# Patient Record
Sex: Female | Born: 1985 | Hispanic: Yes | Smoking: Never smoker
Health system: Southern US, Community
[De-identification: ages and names within clinical notes are randomized; demographics above are authoritative.]

## PROBLEM LIST (undated history)

## (undated) DIAGNOSIS — O1490 Unspecified pre-eclampsia, unspecified trimester: Secondary | ICD-10-CM

## (undated) HISTORY — DX: Unspecified pre-eclampsia, unspecified trimester: O14.90

---

## 2014-09-27 LAB — OB RESULTS CONSOLE PLATELET COUNT: PLATELETS: 193 10*3/uL

## 2014-09-27 LAB — HEMOGLOBINOPATHY EVALUATION
Cystic Fibrosis Mutat: NEGATIVE
Drug Screen, Urine: NEGATIVE
Glucose, GTT - 1 Hour: 95 mg/dL (ref ?–200)
Hemoglobin, Elp: NORMAL
Pap Smear: NORMAL
Urine Culture, OB: NEGATIVE

## 2014-09-27 LAB — OB RESULTS CONSOLE HGB/HCT, BLOOD
HEMATOCRIT: 39 %
Hemoglobin: 13.2 g/dL

## 2014-09-27 LAB — OB RESULTS CONSOLE ANTIBODY SCREEN: ANTIBODY SCREEN: NEGATIVE

## 2014-09-27 LAB — OB RESULTS CONSOLE HIV ANTIBODY (ROUTINE TESTING): HIV: NONREACTIVE

## 2014-09-27 LAB — OB RESULTS CONSOLE RUBELLA ANTIBODY, IGM: RUBELLA: IMMUNE

## 2014-09-27 LAB — OB RESULTS CONSOLE ABO/RH: RH TYPE: POSITIVE

## 2014-09-27 LAB — OB RESULTS CONSOLE GC/CHLAMYDIA
Chlamydia: NEGATIVE
Gonorrhea: NEGATIVE

## 2014-09-27 LAB — OB RESULTS CONSOLE RPR: RPR: NONREACTIVE

## 2014-09-27 LAB — OB RESULTS CONSOLE HEPATITIS B SURFACE ANTIGEN: Hepatitis B Surface Ag: NEGATIVE

## 2014-10-22 ENCOUNTER — Encounter: Payer: Self-pay | Admitting: Obstetrics & Gynecology

## 2014-10-22 ENCOUNTER — Ambulatory Visit (INDEPENDENT_AMBULATORY_CARE_PROVIDER_SITE_OTHER): Payer: Medicaid Other | Admitting: Obstetrics & Gynecology

## 2014-10-22 VITALS — BP 117/77 | HR 79 | Temp 97.9°F | Ht 61.0 in | Wt 167.7 lb

## 2014-10-22 DIAGNOSIS — O0990 Supervision of high risk pregnancy, unspecified, unspecified trimester: Secondary | ICD-10-CM | POA: Insufficient documentation

## 2014-10-22 DIAGNOSIS — O0992 Supervision of high risk pregnancy, unspecified, second trimester: Secondary | ICD-10-CM | POA: Diagnosis not present

## 2014-10-22 DIAGNOSIS — Z23 Encounter for immunization: Secondary | ICD-10-CM

## 2014-10-22 DIAGNOSIS — O09219 Supervision of pregnancy with history of pre-term labor, unspecified trimester: Secondary | ICD-10-CM | POA: Insufficient documentation

## 2014-10-22 DIAGNOSIS — O09212 Supervision of pregnancy with history of pre-term labor, second trimester: Secondary | ICD-10-CM

## 2014-10-22 LAB — POCT URINALYSIS DIP (DEVICE)
Bilirubin Urine: NEGATIVE
Glucose, UA: NEGATIVE mg/dL
HGB URINE DIPSTICK: NEGATIVE
Ketones, ur: NEGATIVE mg/dL
NITRITE: NEGATIVE
PH: 7.5 (ref 5.0–8.0)
Protein, ur: NEGATIVE mg/dL
SPECIFIC GRAVITY, URINE: 1.02 (ref 1.005–1.030)
UROBILINOGEN UA: 0.2 mg/dL (ref 0.0–1.0)

## 2014-10-22 NOTE — Progress Notes (Signed)
Anatomy U/S scheduled for 11/12/2014 @ 7:30AM

## 2014-10-22 NOTE — Patient Instructions (Signed)

## 2014-10-22 NOTE — Progress Notes (Signed)
Here for first visit, transferring care from health department Given pregnancy education information .

## 2014-10-22 NOTE — Progress Notes (Signed)
Nutrition note: 1st visit consult Pt has h/o obesity. Pt has gained 5.7# @ [redacted]w[redacted]d, which is wnl. Pt reports eating 3 meals & 2-3 snacks/d. Pt is taking a PNV. Pt reports having some nausea in the morning & has some heartburn. NKFA. Pt received verbal & written education on general nutrition during pregnancy. Discussed tips to decrease nausea & heartburn. Discussed wt gain goals of 11-20# or 0.5#/wk. Pt agrees to continue taking a PNV. Pt does not have WIC and does not plan to apply right now. Pt plans to BF. F/u as needed Blondell Reveal, MS, RD, LDN, [facility]

## 2014-10-22 NOTE — Progress Notes (Signed)
   Subjective: referred by HD    Krista Mahoney is a [id number] [redacted]w[redacted]d being seen today for her first obstetrical visit.  Her obstetrical history is significant for pre-eclampsia and and IOL preterm. Patient does intend to breast feed. Pregnancy history fully reviewed.  Patient reports cramping.  Filed Vitals:   10/22/14 0912 10/22/14 0914  BP: 117/77   Pulse: 79   Temp: 97.9 F (36.6 C)   Height:   (1.549 m)  Weight: 167 lb 11.2 oz (76.068 kg)     HISTORY: OB History  Gravida Para Term Preterm AB SAB TAB Ectopic Multiple Living  # Outcome Date GA Lbr Len/2nd Weight Sex Delivery Anes PTL Lv  4 Current           3 SAB 2008             Comments: no complications  2 Preterm 12/12/03 [redacted]w[redacted]d  7 lb (3.175 kg) F Vag-Spont EPI  Y     Comments: delivered at [place], preterm delivery- not sure why induced.   1 Preterm 09/15/02 [redacted]w[redacted]d  4 lb (1.814 kg) M Vag-Spont   Y     Comments: delivered at [facility], induced preterm for pre-eclampsia.      Past Medical History  Diagnosis Date  . Preeclampsia    History reviewed. No pertinent past surgical history. Family History  Problem Relation Age of Onset  . Hypertension Mother      Exam    Uterus:     Pelvic Exam:                               System:     Skin: normal coloration and turgor, no rashes    Neurologic: oriented, normal mood   Extremities: normal strength, tone, and muscle mass   HEENT sclera clear, anicteric   Mouth/Teeth dental hygiene good   Neck supple   Cardiovascular: regular rate and rhythm   Respiratory:  appears well, vitals normal, no respiratory distress, acyanotic, normal RR   Abdomen: soft, non-tender; bowel sounds normal; no masses,  no organomegaly          Assessment:    Pregnancy: [id number] Patient Active Problem List   Diagnosis Date Noted  . Previous preterm delivery, antepartum 10/22/2014  . Supervision of high risk pregnancy, antepartum 10/22/2014        Plan:     Initial labs drawn. Prenatal vitamins. Problem list reviewed and updated. Genetic Screening discussed Quad Screen: ordered.  Ultrasound discussed; fetal survey: ordered.  Follow up in 4 weeks. 50% of 30 min visit spent on counseling and coordination of care.  ASA 81 mg /day   Barabara Motz 10/22/2014

## 2014-10-24 LAB — AFP, QUAD SCREEN
AFP: 22.7 ng/mL
Age Alone: 1:817 {titer}
Curr Gest Age: 16.5 wks.days
HCG, Total: 37.08 IU/mL
INH: 261.8 pg/mL
INTERPRETATION-AFP: NEGATIVE
MOM FOR AFP: 0.72
MOM FOR INH: 1.98
MoM for hCG: 1.38
OPEN SPINA BIFIDA: NEGATIVE
Osb Risk: <1:27300 {titer}
TRI 18 SCR RISK EST: NEGATIVE
Trisomy 18 (Edward) Syndrome Interp.: 1:51500 {titer}
uE3 Mom: 1.14
uE3 Value: 1.3 ng/mL

## 2014-11-05 ENCOUNTER — Encounter: Payer: Self-pay | Admitting: *Deleted

## 2014-11-06 ENCOUNTER — Encounter: Payer: Self-pay | Admitting: *Deleted

## 2014-11-12 ENCOUNTER — Other Ambulatory Visit: Payer: Self-pay | Admitting: Obstetrics & Gynecology

## 2014-11-12 ENCOUNTER — Ambulatory Visit (HOSPITAL_COMMUNITY)
Admission: RE | Admit: 2014-11-12 | Discharge: 2014-11-12 | Disposition: A | Discharge status: Home / Self Care | Payer: Medicaid Other | Source: Ambulatory Visit | Attending: Obstetrics & Gynecology | Admitting: Obstetrics & Gynecology

## 2014-11-12 DIAGNOSIS — O09292 Supervision of pregnancy with other poor reproductive or obstetric history, second trimester: Secondary | ICD-10-CM

## 2014-11-12 DIAGNOSIS — Z3A19 19 weeks gestation of pregnancy: Secondary | ICD-10-CM | POA: Diagnosis not present

## 2014-11-12 DIAGNOSIS — Z3689 Encounter for other specified antenatal screening: Secondary | ICD-10-CM

## 2014-11-12 DIAGNOSIS — Z36 Encounter for antenatal screening of mother: Secondary | ICD-10-CM | POA: Insufficient documentation

## 2014-11-12 DIAGNOSIS — O09219 Supervision of pregnancy with history of pre-term labor, unspecified trimester: Secondary | ICD-10-CM | POA: Insufficient documentation

## 2014-11-12 DIAGNOSIS — O09212 Supervision of pregnancy with history of pre-term labor, second trimester: Secondary | ICD-10-CM

## 2014-11-12 DIAGNOSIS — O09299 Supervision of pregnancy with other poor reproductive or obstetric history, unspecified trimester: Secondary | ICD-10-CM | POA: Insufficient documentation

## 2014-11-12 DIAGNOSIS — O0992 Supervision of high risk pregnancy, unspecified, second trimester: Secondary | ICD-10-CM

## 2014-11-19 ENCOUNTER — Ambulatory Visit (INDEPENDENT_AMBULATORY_CARE_PROVIDER_SITE_OTHER): Payer: Self-pay | Admitting: Family Medicine

## 2014-11-19 VITALS — BP 114/59 | HR 75 | Temp 97.7°F | Wt 170.4 lb

## 2014-11-19 DIAGNOSIS — O09212 Supervision of pregnancy with history of pre-term labor, second trimester: Secondary | ICD-10-CM

## 2014-11-19 DIAGNOSIS — O0992 Supervision of high risk pregnancy, unspecified, second trimester: Secondary | ICD-10-CM

## 2014-11-19 DIAGNOSIS — N83202 Unspecified ovarian cyst, left side: Secondary | ICD-10-CM

## 2014-11-19 LAB — POCT URINALYSIS DIP (DEVICE)
BILIRUBIN URINE: NEGATIVE
Glucose, UA: NEGATIVE mg/dL
HGB URINE DIPSTICK: NEGATIVE
KETONES UR: NEGATIVE mg/dL
NITRITE: NEGATIVE
Protein, ur: NEGATIVE mg/dL
SPECIFIC GRAVITY, URINE: 1.015 (ref 1.005–1.030)
Urobilinogen, UA: 0.2 mg/dL (ref 0.0–1.0)
pH: 7 (ref 5.0–8.0)

## 2014-11-19 NOTE — Progress Notes (Signed)
Subjective:  Krista Mahoney is a 29 y.o. [phone] at 6119w5d being seen today for ongoing prenatal care.  Patient reports no complaints.  Contractions: Not present.  Vag. Bleeding: None. Movement: Present. Denies leaking of fluid.   The following portions of the patient's history were reviewed and updated as appropriate: allergies, current medications, past family history, past medical history, past social history, past surgical history and problem list. Problem list updated.  Objective:   Filed Vitals:   11/19/14 0830  BP: 114/59  Pulse: 75  Temp: 97.7 F (36.5 C)  Weight: 170 lb 6.4 oz (77.293 kg)    Fetal Status: Fetal Heart Rate (bpm): 147 Fundal Height: 21 cm Movement: Present     General:  Alert, oriented and cooperative. Patient is in no acute distress.  Skin: Skin is warm and dry. No rash noted.   Cardiovascular: Normal heart rate noted  Respiratory: Normal respiratory effort, no problems with respiration noted  Abdomen: Soft, gravid, appropriate for gestational age. Pain/Pressure: Present     Pelvic: Vag. Bleeding: None     Cervical exam deferred        Extremities: Normal range of motion.  Edema: None  Mental Status: Normal mood and affect. Normal behavior. Normal judgment and thought content.   Urinalysis: Urine Protein: Negative Urine Glucose: Negative  Assessment and Plan:  Pregnancy: [id number] at 2919w5d  1. Supervision of high risk pregnancy, antepartum, second trimester Continue routine prenatal care.  - US MFM OB FOLLOW UP; Future to f/u spine  2. Previous preterm delivery, antepartum, second trimester Due to pre-eclampsia, on Baby ASA  3. Cyst of left ovary F/u possible dermoid with u/s  Preterm labor symptoms and general obstetric precautions including but not limited to vaginal bleeding, contractions, leaking of fluid and fetal movement were reviewed in detail with the patient. Please refer to After Visit Summary for other counseling  recommendations.  Return in 4 weeks (on 12/17/2014).   Reva Boresanya S Kamilya Wakeman, MD

## 2014-11-19 NOTE — Progress Notes (Signed)
Followup U/S with [facility] 12/24/14 @ 8a.  09/27/14 HIV results from [facility] obtained.

## 2014-11-19 NOTE — Progress Notes (Signed)
Patient reports lower back pain  Reviewed tip of week with patient 

## 2014-11-19 NOTE — Patient Instructions (Signed)
Second Trimester of Pregnancy The second trimester is from week 13 through week 28, months 4 through 6. The second trimester is often a time when you feel your best. Your body has also adjusted to being pregnant, and you begin to feel better physically. Usually, morning sickness has lessened or quit completely, you may have more energy, and you may have an increase in appetite. The second trimester is also a time when the fetus is growing rapidly. At the end of the sixth month, the fetus is about 9 inches long and weighs about 1 pounds. You will likely begin to feel the baby move (quickening) between 18 and 20 weeks of the pregnancy. BODY CHANGES Your body goes through many changes during pregnancy. The changes vary from woman to woman.   Your weight will continue to increase. You will notice your lower abdomen bulging out.  You may begin to get stretch marks on your hips, abdomen, and breasts.  You may develop headaches that can be relieved by medicines approved by your health care provider.  You may urinate more often because the fetus is pressing on your bladder.  You may develop or continue to have heartburn as a result of your pregnancy.  You may develop constipation because certain hormones are causing the muscles that push waste through your intestines to slow down.  You may develop hemorrhoids or swollen, bulging veins (varicose veins).  You may have back pain because of the weight gain and pregnancy hormones relaxing your joints between the bones in your pelvis and as a result of a shift in weight and the muscles that support your balance.  Your breasts will continue to grow and be tender.  Your gums may bleed and may be sensitive to brushing and flossing.  Dark spots or blotches (chloasma, mask of pregnancy) may develop on your face. This will likely fade after the baby is born.  A dark line from your belly button to the pubic area (linea nigra) may appear. This will likely  fade after the baby is born.  You may have changes in your hair. These can include thickening of your hair, rapid growth, and changes in texture. Some women also have hair loss during or after pregnancy, or hair that feels dry or thin. Your hair will most likely return to normal after your baby is born. WHAT TO EXPECT AT YOUR PRENATAL VISITS During a routine prenatal visit:  You will be weighed to make sure you and the fetus are growing normally.  Your blood pressure will be taken.  Your abdomen will be measured to track your baby's growth.  The fetal heartbeat will be listened to.  Any test results from the previous visit will be discussed. Your health care provider may ask you:  How you are feeling.  If you are feeling the baby move.  If you have had any abnormal symptoms, such as leaking fluid, bleeding, severe headaches, or abdominal cramping.  If you are using any tobacco products, including cigarettes, chewing tobacco, and electronic cigarettes.  If you have any questions. Other tests that may be performed during your second trimester include:  Blood tests that check for:  Low iron levels (anemia).  Gestational diabetes (between 24 and 28 weeks).  Rh antibodies.  Urine tests to check for infections, diabetes, or protein in the urine.  An ultrasound to confirm the proper growth and development of the baby.  An amniocentesis to check for possible genetic problems.  Fetal screens for spina bifida   and Down syndrome.  HIV (human immunodeficiency virus) testing. Routine prenatal testing includes screening for HIV, unless you choose not to have this test. HOME CARE INSTRUCTIONS   Avoid all smoking, herbs, alcohol, and unprescribed drugs. These chemicals affect the formation and growth of the baby.  Do not use any tobacco products, including cigarettes, chewing tobacco, and electronic cigarettes. If you need help quitting, ask your health care provider. You may receive  counseling support and other resources to help you quit.  Follow your health care provider's instructions regarding medicine use. There are medicines that are either safe or unsafe to take during pregnancy.  Exercise only as directed by your health care provider. Experiencing uterine cramps is a good sign to stop exercising.  Continue to eat regular, healthy meals.  Wear a good support bra for breast tenderness.  Do not use hot tubs, steam rooms, or saunas.  Wear your seat belt at all times when driving.  Avoid raw meat, uncooked cheese, cat litter boxes, and soil used by cats. These carry germs that can cause birth defects in the baby.  Take your prenatal vitamins.  Take 1500-2000 mg of calcium daily starting at the 20th week of pregnancy until you deliver your baby.  Try taking a stool softener (if your health care provider approves) if you develop constipation. Eat more high-fiber foods, such as fresh vegetables or fruit and whole grains. Drink plenty of fluids to keep your urine clear or pale yellow.  Take warm sitz baths to soothe any pain or discomfort caused by hemorrhoids. Use hemorrhoid cream if your health care provider approves.  If you develop varicose veins, wear support hose. Elevate your feet for 15 minutes, 3-4 times a day. Limit salt in your diet.  Avoid heavy lifting, wear low heel shoes, and practice good posture.  Rest with your legs elevated if you have leg cramps or low back pain.  Visit your dentist if you have not gone yet during your pregnancy. Use a soft toothbrush to brush your teeth and be gentle when you floss.  A sexual relationship may be continued unless your health care provider directs you otherwise.  Continue to go to all your prenatal visits as directed by your health care provider. SEEK MEDICAL CARE IF:   You have dizziness.  You have mild pelvic cramps, pelvic pressure, or nagging pain in the abdominal area.  You have persistent nausea,  vomiting, or diarrhea.  You have a bad smelling vaginal discharge.  You have pain with urination. SEEK IMMEDIATE MEDICAL CARE IF:   You have a fever.  You are leaking fluid from your vagina.  You have spotting or bleeding from your vagina.  You have severe abdominal cramping or pain.  You have rapid weight gain or loss.  You have shortness of breath with chest pain.  You notice sudden or extreme swelling of your face, hands, ankles, feet, or legs.  You have not felt your baby move in over an hour.  You have severe headaches that do not go away with medicine.  You have vision changes.   This information is not intended to replace advice given to you by your health care provider. Make sure you discuss any questions you have with your health care provider.   Document Released: 12/23/2000 Document Revised: 01/19/2014 Document Reviewed: 03/01/2012 Elsevier Interactive Patient Education 2016 [employer]   Breastfeeding Deciding to breastfeed is one of the best choices you can make for you and your baby. A change   in hormones during pregnancy causes your breast tissue to grow and increases the number and size of your milk ducts. These hormones also allow proteins, sugars, and fats from your blood supply to make breast milk in your milk-producing glands. Hormones prevent breast milk from being released before your baby is born as well as prompt milk flow after birth. Once breastfeeding has begun, thoughts of your baby, as well as his or her sucking or crying, can stimulate the release of milk from your milk-producing glands.  BENEFITS OF BREASTFEEDING For Your Baby  Your first milk (colostrum) helps your baby's digestive system function better.  There are antibodies in your milk that help your baby fight off infections.  Your baby has a lower incidence of asthma, allergies, and sudden infant death syndrome.  The nutrients in breast milk are better for your baby than infant  formulas and are designed uniquely for your baby's needs.  Breast milk improves your baby's brain development.  Your baby is less likely to develop other conditions, such as childhood obesity, asthma, or type 2 diabetes mellitus. For You  Breastfeeding helps to create a very special bond between you and your baby.  Breastfeeding is convenient. Breast milk is always available at the correct temperature and costs nothing.  Breastfeeding helps to burn calories and helps you lose the weight gained during pregnancy.  Breastfeeding makes your uterus contract to its prepregnancy size faster and slows bleeding (lochia) after you give birth.   Breastfeeding helps to lower your risk of developing type 2 diabetes mellitus, osteoporosis, and breast or ovarian cancer later in life. SIGNS THAT YOUR BABY IS HUNGRY Early Signs of Hunger  Increased alertness or activity.  Stretching.  Movement of the head from side to side.  Movement of the head and opening of the mouth when the corner of the mouth or cheek is stroked (rooting).  Increased sucking sounds, smacking lips, cooing, sighing, or squeaking.  Hand-to-mouth movements.  Increased sucking of fingers or hands. Late Signs of Hunger  Fussing.  Intermittent crying. Extreme Signs of Hunger Signs of extreme hunger will require calming and consoling before your baby will be able to breastfeed successfully. Do not wait for the following signs of extreme hunger to occur before you initiate breastfeeding:  Restlessness.  A loud, strong cry.  Screaming. BREASTFEEDING BASICS Breastfeeding Initiation  Find a comfortable place to sit or lie down, with your neck and back well supported.  Place a pillow or rolled up blanket under your baby to bring him or her to the level of your breast (if you are seated). Nursing pillows are specially designed to help support your arms and your baby while you breastfeed.  Make sure that your baby's  abdomen is facing your abdomen.  Gently massage your breast. With your fingertips, massage from your chest wall toward your nipple in a circular motion. This encourages milk flow. You may need to continue this action during the feeding if your milk flows slowly.  Support your breast with 4 fingers underneath and your thumb above your nipple. Make sure your fingers are well away from your nipple and your baby's mouth.  Stroke your baby's lips gently with your finger or nipple.  When your baby's mouth is open wide enough, quickly bring your baby to your breast, placing your entire nipple and as much of the colored area around your nipple (areola) as possible into your baby's mouth.  More areola should be visible above your baby's upper lip than   below the lower lip.  Your baby's tongue should be between his or her lower gum and your breast.  Ensure that your baby's mouth is correctly positioned around your nipple (latched). Your baby's lips should create a seal on your breast and be turned out (everted).  It is common for your baby to suck about 2-3 minutes in order to start the flow of breast milk. Latching Teaching your baby how to latch on to your breast properly is very important. An improper latch can cause nipple pain and decreased milk supply for you and poor weight gain in your baby. Also, if your baby is not latched onto your nipple properly, he or she may swallow some air during feeding. This can make your baby fussy. Burping your baby when you switch breasts during the feeding can help to get rid of the air. However, teaching your baby to latch on properly is still the best way to prevent fussiness from swallowing air while breastfeeding. Signs that your baby has successfully latched on to your nipple:  Silent tugging or silent sucking, without causing you pain.  Swallowing heard between every 3-4 sucks.  Muscle movement above and in front of his or her ears while sucking. Signs  that your baby has not successfully latched on to nipple:  Sucking sounds or smacking sounds from your baby while breastfeeding.  Nipple pain. If you think your baby has not latched on correctly, slip your finger into the corner of your baby's mouth to break the suction and place it between your baby's gums. Attempt breastfeeding initiation again. Signs of Successful Breastfeeding Signs from your baby:  A gradual decrease in the number of sucks or complete cessation of sucking.  Falling asleep.  Relaxation of his or her body.  Retention of a small amount of milk in his or her mouth.  Letting go of your breast by himself or herself. Signs from you:  Breasts that have increased in firmness, weight, and size 1-3 hours after feeding.  Breasts that are softer immediately after breastfeeding.  Increased milk volume, as well as a change in milk consistency and color by the fifth day of breastfeeding.  Nipples that are not sore, cracked, or bleeding. Signs That Your Baby is Getting Enough Milk  Wetting at least 3 diapers in a 24-hour period. The urine should be clear and pale yellow by age 5 days.  At least 3 stools in a 24-hour period by age 5 days. The stool should be soft and yellow.  At least 3 stools in a 24-hour period by age 7 days. The stool should be seedy and yellow.  No loss of weight greater than 10% of birth weight during the first 3 days of age.  Average weight gain of 4-7 ounces (113-198 g) per week after age 4 days.  Consistent daily weight gain by age 5 days, without weight loss after the age of 2 weeks. After a feeding, your baby may spit up a small amount. This is common. BREASTFEEDING FREQUENCY AND DURATION Frequent feeding will help you make more milk and can prevent sore nipples and breast engorgement. Breastfeed when you feel the need to reduce the fullness of your breasts or when your baby shows signs of hunger. This is called "breastfeeding on demand." Avoid  introducing a pacifier to your baby while you are working to establish breastfeeding (the first 4-6 weeks after your baby is born). After this time you may choose to use a pacifier. Research has shown that   pacifier use during the first year of a baby's life decreases the risk of sudden infant death syndrome (SIDS). Allow your baby to feed on each breast as long as he or she wants. Breastfeed until your baby is finished feeding. When your baby unlatches or falls asleep while feeding from the first breast, offer the second breast. Because newborns are often sleepy in the first few weeks of life, you may need to awaken your baby to get him or her to feed. Breastfeeding times will vary from baby to baby. However, the following rules can serve as a guide to help you ensure that your baby is properly fed:  Newborns (babies 4 weeks of age or younger) may breastfeed every 1-3 hours.  Newborns should not go longer than 3 hours during the day or 5 hours during the night without breastfeeding.  You should breastfeed your baby a minimum of 8 times in a 24-hour period until you begin to introduce solid foods to your baby at around 6 months of age. BREAST MILK PUMPING Pumping and storing breast milk allows you to ensure that your baby is exclusively fed your breast milk, even at times when you are unable to breastfeed. This is especially important if you are going back to work while you are still breastfeeding or when you are not able to be present during feedings. Your lactation consultant can give you guidelines on how long it is safe to store breast milk. A breast pump is a machine that allows you to pump milk from your breast into a sterile bottle. The pumped breast milk can then be stored in a refrigerator or freezer. Some breast pumps are operated by hand, while others use electricity. Ask your lactation consultant which type will work best for you. Breast pumps can be purchased, but some hospitals and  breastfeeding support groups lease breast pumps on a monthly basis. A lactation consultant can teach you how to hand express breast milk, if you prefer not to use a pump. CARING FOR YOUR BREASTS WHILE YOU BREASTFEED Nipples can become dry, cracked, and sore while breastfeeding. The following recommendations can help keep your breasts moisturized and healthy:  Avoid using soap on your nipples.  Wear a supportive bra. Although not required, special nursing bras and tank tops are designed to allow access to your breasts for breastfeeding without taking off your entire bra or top. Avoid wearing underwire-style bras or extremely tight bras.  Air dry your nipples for 3-4minutes after each feeding.  Use only cotton bra pads to absorb leaked breast milk. Leaking of breast milk between feedings is normal.  Use lanolin on your nipples after breastfeeding. Lanolin helps to maintain your skin's normal moisture barrier. If you use pure lanolin, you do not need to wash it off before feeding your baby again. Pure lanolin is not toxic to your baby. You may also hand express a few drops of breast milk and gently massage that milk into your nipples and allow the milk to air dry. In the first few weeks after giving birth, some women experience extremely full breasts (engorgement). Engorgement can make your breasts feel heavy, warm, and tender to the touch. Engorgement peaks within 3-5 days after you give birth. The following recommendations can help ease engorgement:  Completely empty your breasts while breastfeeding or pumping. You may want to start by applying warm, moist heat (in the shower or with warm water-soaked hand towels) just before feeding or pumping. This increases circulation and helps the milk   flow. If your baby does not completely empty your breasts while breastfeeding, pump any extra milk after he or she is finished.  Wear a snug bra (nursing or regular) or tank top for 1-2 days to signal your body  to slightly decrease milk production.  Apply ice packs to your breasts, unless this is too uncomfortable for you.  Make sure that your baby is latched on and positioned properly while breastfeeding. If engorgement persists after 48 hours of following these recommendations, contact your health care provider or a lactation consultant. OVERALL HEALTH CARE RECOMMENDATIONS WHILE BREASTFEEDING  Eat healthy foods. Alternate between meals and snacks, eating 3 of each per day. Because what you eat affects your breast milk, some of the foods may make your baby more irritable than usual. Avoid eating these foods if you are sure that they are negatively affecting your baby.  Drink milk, fruit juice, and water to satisfy your thirst (about 10 glasses a day).  Rest often, relax, and continue to take your prenatal vitamins to prevent fatigue, stress, and anemia.  Continue breast self-awareness checks.  Avoid chewing and smoking tobacco. Chemicals from cigarettes that pass into breast milk and exposure to secondhand smoke may harm your baby.  Avoid alcohol and drug use, including marijuana. Some medicines that may be harmful to your baby can pass through breast milk. It is important to ask your health care provider before taking any medicine, including all over-the-counter and prescription medicine as well as vitamin and herbal supplements. It is possible to become pregnant while breastfeeding. If birth control is desired, ask your health care provider about options that will be safe for your baby. SEEK MEDICAL CARE IF:  You feel like you want to stop breastfeeding or have become frustrated with breastfeeding.  You have painful breasts or nipples.  Your nipples are cracked or bleeding.  Your breasts are red, tender, or warm.  You have a swollen area on either breast.  You have a fever or chills.  You have nausea or vomiting.  You have drainage other than breast milk from your nipples.  Your  breasts do not become full before feedings by the fifth day after you give birth.  You feel sad and depressed.  Your baby is too sleepy to eat well.  Your baby is having trouble sleeping.   Your baby is wetting less than 3 diapers in a 24-hour period.  Your baby has less than 3 stools in a 24-hour period.  Your baby's skin or the white part of his or her eyes becomes yellow.   Your baby is not gaining weight by 5 days of age. SEEK IMMEDIATE MEDICAL CARE IF:  Your baby is overly tired (lethargic) and does not want to wake up and feed.  Your baby develops an unexplained fever.   This information is not intended to replace advice given to you by your health care provider. Make sure you discuss any questions you have with your health care provider.   Document Released: 12/29/2004 Document Revised: 09/19/2014 Document Reviewed: 06/22/2012 Elsevier Interactive Patient Education 2016 [employer]  

## 2014-12-20 ENCOUNTER — Ambulatory Visit (INDEPENDENT_AMBULATORY_CARE_PROVIDER_SITE_OTHER): Payer: Self-pay | Admitting: Obstetrics & Gynecology

## 2014-12-20 VITALS — BP 117/65 | HR 73 | Temp 97.8°F | Wt 174.8 lb

## 2014-12-20 DIAGNOSIS — O3482 Maternal care for other abnormalities of pelvic organs, second trimester: Secondary | ICD-10-CM

## 2014-12-20 DIAGNOSIS — O09299 Supervision of pregnancy with other poor reproductive or obstetric history, unspecified trimester: Secondary | ICD-10-CM | POA: Insufficient documentation

## 2014-12-20 DIAGNOSIS — N83202 Unspecified ovarian cyst, left side: Secondary | ICD-10-CM

## 2014-12-20 DIAGNOSIS — O09292 Supervision of pregnancy with other poor reproductive or obstetric history, second trimester: Secondary | ICD-10-CM

## 2014-12-20 DIAGNOSIS — O0992 Supervision of high risk pregnancy, unspecified, second trimester: Secondary | ICD-10-CM

## 2014-12-20 LAB — POCT URINALYSIS DIP (DEVICE)
BILIRUBIN URINE: NEGATIVE
Glucose, UA: NEGATIVE mg/dL
HGB URINE DIPSTICK: NEGATIVE
Ketones, ur: NEGATIVE mg/dL
NITRITE: NEGATIVE
PH: 7 (ref 5.0–8.0)
Protein, ur: NEGATIVE mg/dL
SPECIFIC GRAVITY, URINE: 1.02 (ref 1.005–1.030)
Urobilinogen, UA: 0.2 mg/dL (ref 0.0–1.0)

## 2014-12-20 NOTE — Patient Instructions (Signed)
Return to clinic for any obstetric concerns or go to [facility] for evaluation  

## 2014-12-20 NOTE — Progress Notes (Signed)
Breastfeeding tip of the week reviewed. 

## 2014-12-20 NOTE — Progress Notes (Signed)
Subjective:  Karen KaysGuadalupe Avendano Vazquez is a 29 y.o. [phone] at 4452w1d being seen today for ongoing prenatal care.  She is currently monitored for the following issues for this high-risk pregnancy and has Previous preterm delivery, antepartum; Supervision of high risk pregnancy, antepartum; Cyst of left ovary; and Hx of preeclampsia, prior pregnancy, currently pregnant on her problem list.  Patient reports no complaints.  Contractions: Not present. Vag. Bleeding: None.  Movement: Present. Denies leaking of fluid.   The following portions of the patient's history were reviewed and updated as appropriate: allergies, current medications, past family history, past medical history, past social history, past surgical history and problem list. Problem list updated.  Objective:   Filed Vitals:   12/20/14 0822  BP: 117/65  Pulse: 73  Temp: 97.8 F (36.6 C)  Weight: 174 lb 12.8 oz (79.289 kg)    Fetal Status: Fetal Heart Rate (bpm): 140 Fundal Height: 25 cm Movement: Present     General:  Alert, oriented and cooperative. Patient is in no acute distress.  Skin: Skin is warm and dry. No rash noted.   Cardiovascular: Normal heart rate noted  Respiratory: Normal respiratory effort, no problems with respiration noted  Abdomen: Soft, gravid, appropriate for gestational age. Pain/Pressure: Absent     Pelvic: Vag. Bleeding: None     Cervical exam deferred        Extremities: Normal range of motion.  Edema: None  Mental Status: Normal mood and affect. Normal behavior. Normal judgment and thought content.   Urinalysis: Urine Protein: Negative Urine Glucose: Negative  Assessment and Plan:  Pregnancy: [id number] at 2552w1d  1. Hx of preeclampsia, prior pregnancy, currently pregnant, second trimester Continue ASA 81 mg daily. Stable BP  2. Supervision of high risk pregnancy, antepartum, second trimester Preterm labor symptoms and general obstetric precautions including but not limited to vaginal bleeding,  contractions, leaking of fluid and fetal movement were reviewed in detail with the patient. Please refer to After Visit Summary for other counseling recommendations.  Return in about 3 weeks (around 01/10/2015) for 1 hr GTT, 3rd trimester labs, TDap, OB Visit.   Tereso NewcomerUgonna A Anyanwu, MD

## 2014-12-24 ENCOUNTER — Ambulatory Visit (HOSPITAL_COMMUNITY)
Admission: RE | Admit: 2014-12-24 | Discharge: 2014-12-24 | Disposition: A | Discharge status: Home / Self Care | Payer: Self-pay | Source: Ambulatory Visit | Attending: Family Medicine | Admitting: Family Medicine

## 2014-12-24 ENCOUNTER — Other Ambulatory Visit: Payer: Self-pay | Admitting: Family Medicine

## 2014-12-24 ENCOUNTER — Encounter (HOSPITAL_COMMUNITY): Payer: Self-pay

## 2014-12-24 DIAGNOSIS — Z0489 Encounter for examination and observation for other specified reasons: Secondary | ICD-10-CM

## 2014-12-24 DIAGNOSIS — N83209 Unspecified ovarian cyst, unspecified side: Secondary | ICD-10-CM

## 2014-12-24 DIAGNOSIS — O3482 Maternal care for other abnormalities of pelvic organs, second trimester: Secondary | ICD-10-CM

## 2014-12-24 DIAGNOSIS — Z3A25 25 weeks gestation of pregnancy: Secondary | ICD-10-CM | POA: Insufficient documentation

## 2014-12-24 DIAGNOSIS — O09212 Supervision of pregnancy with history of pre-term labor, second trimester: Secondary | ICD-10-CM | POA: Insufficient documentation

## 2014-12-24 DIAGNOSIS — O0992 Supervision of high risk pregnancy, unspecified, second trimester: Secondary | ICD-10-CM

## 2014-12-24 DIAGNOSIS — IMO0002 Reserved for concepts with insufficient information to code with codable children: Secondary | ICD-10-CM

## 2014-12-24 DIAGNOSIS — Z36 Encounter for antenatal screening of mother: Secondary | ICD-10-CM | POA: Insufficient documentation

## 2014-12-24 DIAGNOSIS — O09292 Supervision of pregnancy with other poor reproductive or obstetric history, second trimester: Secondary | ICD-10-CM | POA: Insufficient documentation

## 2015-01-10 ENCOUNTER — Ambulatory Visit (INDEPENDENT_AMBULATORY_CARE_PROVIDER_SITE_OTHER): Payer: Self-pay | Admitting: Family Medicine

## 2015-01-10 ENCOUNTER — Encounter: Payer: Self-pay | Admitting: Family Medicine

## 2015-01-10 VITALS — BP 113/60 | HR 84 | Temp 97.9°F | Wt 175.2 lb

## 2015-01-10 DIAGNOSIS — O09293 Supervision of pregnancy with other poor reproductive or obstetric history, third trimester: Secondary | ICD-10-CM

## 2015-01-10 DIAGNOSIS — O0993 Supervision of high risk pregnancy, unspecified, third trimester: Secondary | ICD-10-CM

## 2015-01-10 DIAGNOSIS — O09213 Supervision of pregnancy with history of pre-term labor, third trimester: Secondary | ICD-10-CM

## 2015-01-10 LAB — CBC
HCT: 39.5 % (ref 36.0–46.0)
Hemoglobin: 13.1 g/dL (ref 12.0–15.0)
MCH: 29.7 pg (ref 26.0–34.0)
MCHC: 33.2 g/dL (ref 30.0–36.0)
MCV: 89.6 fL (ref 78.0–100.0)
MPV: 12 fL (ref 8.6–12.4)
PLATELETS: 236 10*3/uL (ref 150–400)
RBC: 4.41 MIL/uL (ref 3.87–5.11)
RDW: 13.9 % (ref 11.5–15.5)
WBC: 11.2 10*3/uL — AB (ref 4.0–10.5)

## 2015-01-10 LAB — POCT URINALYSIS DIP (DEVICE)
BILIRUBIN URINE: NEGATIVE
GLUCOSE, UA: NEGATIVE mg/dL
HGB URINE DIPSTICK: NEGATIVE
Ketones, ur: NEGATIVE mg/dL
LEUKOCYTES UA: NEGATIVE
NITRITE: NEGATIVE
Protein, ur: NEGATIVE mg/dL
SPECIFIC GRAVITY, URINE: 1.02 (ref 1.005–1.030)
Urobilinogen, UA: 0.2 mg/dL (ref 0.0–1.0)
pH: 7 (ref 5.0–8.0)

## 2015-01-10 MED ORDER — TETANUS-DIPHTH-ACELL PERTUSSIS 5-2.5-18.5 LF-MCG/0.5 IM SUSP
0.5000 mL | Freq: Once | INTRAMUSCULAR | Status: AC
  Administered 2015-01-10: 0.5 mL via INTRAMUSCULAR

## 2015-01-10 NOTE — Patient Instructions (Signed)
Tercer trimestre de embarazo (Third Trimester of Pregnancy) El tercer trimestre comprende desde la semana29 hasta la semana42, es decir, desde el mes7 hasta el mes9. El tercer trimestre es un perodo en el que el feto crece rpidamente. Hacia el final del noveno mes, el feto mide alrededor de 20pulgadas (45cm) de largo y pesa entre 6 y 10 libras (2,700 y 4,500kg).  CAMBIOS EN EL ORGANISMO Su organismo atraviesa por muchos cambios durante el embarazo, y estos varan de una mujer a otra.   Seguir aumentando de peso. Es de esperar que aumente entre 25 y 35libras (11 y 16kg) hacia el final del embarazo.  Podrn aparecer las primeras estras en las caderas, el abdomen y las mamas.  Puede tener necesidad de orinar con ms frecuencia porque el feto baja hacia la pelvis y ejerce presin sobre la vejiga.  Debido al embarazo podr sentir acidez estomacal con frecuencia.  Puede estar estreida, ya que ciertas hormonas enlentecen los movimientos de los msculos que empujan los desechos a travs de los intestinos.  Pueden aparecer hemorroides o abultarse e hincharse las venas (venas varicosas).  Puede sentir dolor plvico debido al aumento de peso y a que las hormonas del embarazo relajan las articulaciones entre los huesos de la pelvis. El dolor de espalda puede ser consecuencia de la sobrecarga de los msculos que soportan la postura.  Tal vez haya cambios en el cabello que pueden incluir su engrosamiento, crecimiento rpido y cambios en la textura. Adems, a algunas mujeres se les cae el cabello durante o despus del embarazo, o tienen el cabello seco o fino. Lo ms probable es que el cabello se le normalice despus del nacimiento del beb.  Las mamas seguirn creciendo y le dolern. A veces, puede haber una secrecin amarilla de las mamas llamada calostro.  El ombligo puede salir hacia afuera.  Puede sentir que le falta el aire debido a que se expande el tero.  Puede notar que el feto  "baja" o lo siente ms bajo, en el abdomen.  Puede tener una prdida de secrecin mucosa con sangre. Esto suele ocurrir en el trmino de unos pocos das a una semana antes de que comience el trabajo de parto.  El cuello del tero se vuelve delgado y blando (se borra) cerca de la fecha de parto. QU DEBE ESPERAR EN LOS EXMENES PRENATALES  Le harn exmenes prenatales cada 2semanas hasta la semana36. A partir de ese momento le harn exmenes semanales. Durante una visita prenatal de rutina:  La pesarn para asegurarse de que usted y el feto estn creciendo normalmente.  Le tomarn la presin arterial.  Le medirn el abdomen para controlar el desarrollo del beb.  Se escucharn los latidos cardacos fetales.  Se evaluarn los resultados de los estudios solicitados en visitas anteriores.  Le revisarn el cuello del tero cuando est prxima la fecha de parto para controlar si este se ha borrado. Alrededor de la semana36, el mdico le revisar el cuello del tero. Al mismo tiempo, realizar un anlisis de las secreciones del tejido vaginal. Este examen es para determinar si hay un tipo de bacteria, estreptococo Grupo B. El mdico le explicar esto con ms detalle. El mdico puede preguntarle lo siguiente:  Cmo le gustara que fuera el parto.  Cmo se siente.  Si siente los movimientos del beb.  Si ha tenido sntomas anormales, como prdida de lquido, sangrado, dolores de cabeza intensos o clicos abdominales.  Si est consumiendo algn producto que contenga tabaco, como cigarrillos, tabaco   de mascar y cigarrillos electrnicos.  Si tiene alguna pregunta. Otros exmenes o estudios de deteccin que pueden realizarse durante el tercer trimestre incluyen lo siguiente:  Anlisis de sangre para controlar los niveles de hierro (anemia).  Controles fetales para determinar su salud, nivel de actividad y crecimiento. Si tiene alguna enfermedad o hay problemas durante el embarazo, le harn  estudios.  Prueba del VIH (virus de inmunodeficiencia humana). Si corre un riesgo alto, pueden realizarle una prueba de deteccin del VIH durante el tercer trimestre del embarazo. FALSO TRABAJO DE PARTO Es posible que sienta contracciones leves e irregulares que finalmente desaparecen. Se llaman contracciones de Braxton Hicks o falso trabajo de parto. Las contracciones pueden durar horas, das o incluso semanas, antes de que el verdadero trabajo de parto se inicie. Si las contracciones ocurren a intervalos regulares, se intensifican o se hacen dolorosas, lo mejor es que la revise el mdico.  SIGNOS DE TRABAJO DE PARTO   Clicos de tipo menstrual.  Contracciones cada 5minutos o menos.  Contracciones que comienzan en la parte superior del tero y se extienden hacia abajo, a la zona inferior del abdomen y la espalda.  Sensacin de mayor presin en la pelvis o dolor de espalda.  Una secrecin de mucosidad acuosa o con sangre que sale de la vagina. Si tiene alguno de estos signos antes de la semana37 del embarazo, llame a su mdico de inmediato. Debe concurrir al hospital para que la controlen inmediatamente. INSTRUCCIONES PARA EL CUIDADO EN EL HOGAR   Evite fumar, consumir hierbas, beber alcohol y tomar frmacos que no le hayan recetado. Estas sustancias qumicas afectan la formacin y el desarrollo del beb.  No consuma ningn producto que contenga tabaco, lo que incluye cigarrillos, tabaco de mascar y cigarrillos electrnicos. Si necesita ayuda para dejar de fumar, consulte al mdico. Puede recibir asesoramiento y otro tipo de recursos para dejar de fumar.  Siga las indicaciones del mdico en relacin con el uso de medicamentos. Durante el embarazo, hay medicamentos que son seguros de tomar y otros que no.  Haga ejercicio solamente como se lo haya indicado el mdico. Sentir clicos uterinos es un buen signo para detener la actividad fsica.  Contine comiendo alimentos sanos con  regularidad.  Use un sostn que le brinde buen soporte si le duelen las mamas.  No se d baos de inmersin en agua caliente, baos turcos ni saunas.  Use el cinturn de seguridad en todo momento mientras conduce.  No coma carne cruda ni queso sin cocinar; evite el contacto con las bandejas sanitarias de los gatos y la tierra que estos animales usan. Estos elementos contienen grmenes que pueden causar defectos congnitos en el beb.  Tome las vitaminas prenatales.  Tome entre 1500 y 2000mg de calcio diariamente comenzando en la semana20 del embarazo hasta el parto.  Si est estreida, pruebe un laxante suave (si el mdico lo autoriza). Consuma ms alimentos ricos en fibra, como vegetales y frutas frescos y cereales integrales. Beba gran cantidad de lquido para mantener la orina de tono claro o color amarillo plido.  Dese baos de asiento con agua tibia para aliviar el dolor o las molestias causadas por las hemorroides. Use una crema para las hemorroides si el mdico la autoriza.  Si tiene venas varicosas, use medias de descanso. Eleve los pies durante 15minutos, 3 o 4veces por da. Limite el consumo de sal en su dieta.  Evite levantar objetos pesados, use zapatos de tacones bajos y mantenga una buena postura.  Descanse   con las piernas elevadas si tiene calambres o dolor de cintura.  Visite a su dentista si no lo ha hecho durante el embarazo. Use un cepillo de dientes blando para higienizarse los dientes y psese el hilo dental con suavidad.  Puede seguir manteniendo relaciones sexuales, a menos que el mdico le indique lo contrario.  No haga viajes largos excepto que sea absolutamente necesario y solo con la autorizacin del mdico.  Tome clases prenatales para entender, practicar y hacer preguntas sobre el trabajo de parto y el parto.  Haga un ensayo de la partida al hospital.  Prepare el bolso que llevar al hospital.  Prepare la habitacin del beb.  Concurra a todas  las visitas prenatales segn las indicaciones de su mdico. SOLICITE ATENCIN MDICA SI:  No est segura de que est en trabajo de parto o de que ha roto la bolsa de las aguas.  Tiene mareos.  Siente clicos leves, presin en la pelvis o dolor persistente en el abdomen.  Tiene nuseas, vmitos o diarrea persistentes.  Observa una secrecin vaginal con mal olor.  Siente dolor al orinar. SOLICITE ATENCIN MDICA DE INMEDIATO SI:   Tiene fiebre.  Tiene una prdida de lquido por la vagina.  Tiene sangrado o pequeas prdidas vaginales.  Siente dolor intenso o clicos en el abdomen.  Sube o baja de peso rpidamente.  Tiene dificultad para respirar y siente dolor de pecho.  Sbitamente se le hinchan mucho el rostro, las manos, los tobillos, los pies o las piernas.  No ha sentido los movimientos del beb durante una hora.  Siente un dolor de cabeza intenso que no se alivia con medicamentos.  Su visin se modifica.   Esta informacin no tiene como fin reemplazar el consejo del mdico. Asegrese de hacerle al mdico cualquier pregunta que tenga.   Document Released: 10/08/2004 Document Revised: 01/19/2014 Elsevier Interactive Patient Education 2016 [employer]   Lactancia materna (Breastfeeding) Decidir amamantar es una de las mejores elecciones que puede hacer por usted y su beb. El cambio hormonal durante el embarazo produce el desarrollo del tejido mamario y aumenta la cantidad y el tamao de los conductos galactforos. Estas hormonas tambin permiten que las protenas, los azcares y las grasas de la sangre produzcan la leche materna en las glndulas productoras de leche. Las hormonas impiden que la leche materna sea liberada antes del nacimiento del beb, adems de impulsar el flujo de leche luego del nacimiento. Una vez que ha comenzado a amamantar, pensar en el beb, as como la succin o el llanto, pueden estimular la liberacin de leche de las glndulas productoras de  leche.  LOS BENEFICIOS DE AMAMANTAR Para el beb  La primera leche (calostro) ayuda a mejorar el funcionamiento del sistema digestivo del beb.  La leche tiene anticuerpos que ayudan a prevenir las infecciones en el beb.  El beb tiene una menor incidencia de asma, alergias y del sndrome de muerte sbita del lactante.  Los nutrientes en la leche materna son mejores para el beb que la leche maternizada y estn preparados exclusivamente para cubrir las necesidades del beb.  La leche materna mejora el desarrollo cerebral del beb.  Es menos probable que el beb desarrolle otras enfermedades, como obesidad infantil, asma o diabetes mellitus de tipo 2. Para usted   La lactancia materna favorece el desarrollo de un vnculo muy especial entre la madre y el beb.  Es conveniente. La leche materna siempre est disponible a la temperatura correcta y es econmica.  La lactancia   materna ayuda a quemar caloras y a perder el peso ganado durante el embarazo.  Favorece la contraccin del tero al tamao que tena antes del embarazo de manera ms rpida y disminuye el sangrado (loquios) despus del parto.  La lactancia materna contribuye a reducir el riesgo de desarrollar diabetes mellitus de tipo 2, osteoporosis o cncer de mama o de ovario en el futuro. SIGNOS DE QUE EL BEB EST HAMBRIENTO Primeros signos de hambre  Aumenta su estado de alerta o actividad.  Se estira.  Mueve la cabeza de un lado a otro.  Mueve la cabeza y abre la boca cuando se le toca la mejilla o la comisura de la boca (reflejo de bsqueda).  Aumenta las vocalizaciones, tales como sonidos de succin, se relame los labios, emite arrullos, suspiros, o chirridos.  Mueve la mano hacia la boca.  Se chupa con ganas los dedos o las manos. Signos tardos de hambre  Est agitado.  Llora de manera intermitente. Signos de hambre extrema Los signos de hambre extrema requerirn que lo calme y lo consuele antes de que el  beb pueda alimentarse adecuadamente. No espere a que se manifiesten los siguientes signos de hambre extrema para comenzar a amamantar:   Agitacin.  Llanto intenso y fuerte.  Gritos. INFORMACIN BSICA SOBRE LA LACTANCIA MATERNA Iniciacin de la lactancia materna  Encuentre un lugar cmodo para sentarse o acostarse, con un buen respaldo para el cuello y la espalda.  Coloque una almohada o una manta enrollada debajo del beb para acomodarlo a la altura de la mama (si est sentada). Las almohadas para amamantar se han diseado especialmente a fin de servir de apoyo para los brazos y el beb mientras amamanta.  Asegrese de que el abdomen del beb est frente al suyo.   Masajee suavemente la mama. Con las yemas de los dedos, masajee la pared del pecho hacia el pezn en un movimiento circular. Esto estimula el flujo de leche. Es posible que deba continuar este movimiento mientras amamanta si la leche fluye lentamente.  Sostenga la mama con el pulgar por arriba del pezn y los otros 4 dedos por debajo de la mama. Asegrese de que los dedos se encuentren lejos del pezn y de la boca del beb.  Empuje suavemente los labios del beb con el pezn o con el dedo.  Cuando la boca del beb se abra lo suficiente, acrquelo rpidamente a la mama e introduzca todo el pezn y la zona oscura que lo rodea (areola), tanto como sea posible, dentro de la boca del beb.  Debe haber ms areola visible por arriba del labio superior del beb que por debajo del labio inferior.  La lengua del beb debe estar entre la enca inferior y la mama.  Asegrese de que la boca del beb est en la posicin correcta alrededor del pezn (prendida). Los labios del beb deben crear un sello sobre la mama y estar doblados hacia afuera (invertidos).  Es comn que el beb succione durante 2 a 3 minutos para que comience el flujo de leche materna. Cmo debe prenderse Es muy importante que le ensee al beb cmo prenderse  adecuadamente a la mama. Si el beb no se prende adecuadamente, puede causarle dolor en el pezn y reducir la produccin de leche materna, y hacer que el beb tenga un escaso aumento de peso. Adems, si el beb no se prende adecuadamente al pezn, puede tragar aire durante la alimentacin. Esto puede causarle molestias al beb. Hacer eructar al beb al   cambiar de mama puede ayudarlo a liberar el aire. Sin embargo, ensearle al beb cmo prenderse a la mama adecuadamente es la mejor manera de evitar que se sienta molesto por tragar aire mientras se alimenta. Signos de que el beb se ha prendido adecuadamente al pezn:   Tironea o succiona de modo silencioso, sin causarle dolor.  Se escucha que traga cada 3 o 4 succiones.  Hay movimientos musculares por arriba y por delante de sus odos al succionar. Signos de que el beb no se ha prendido adecuadamente al pezn:   Hace ruidos de succin o de chasquido mientras se alimenta.  Siente dolor en el pezn. Si cree que el beb no se prendi correctamente, deslice el dedo en la comisura de la boca y colquelo entre las encas del beb para interrumpir la succin. Intente comenzar a amamantar nuevamente. Signos de lactancia materna exitosa Signos del beb:   Disminuye gradualmente el nmero de succiones o cesa la succin por completo.  Se duerme.  Relaja el cuerpo.  Retiene una pequea cantidad de leche en la boca.  Se desprende solo del pecho. Signos que presenta usted:  Las mamas han aumentado la firmeza, el peso y el tamao 1 a 3 horas despus de amamantar.  Estn ms blandas inmediatamente despus de amamantar.  Un aumento del volumen de leche, y tambin un cambio en su consistencia y color se producen hacia el quinto da de lactancia materna.  Los pezones no duelen, ni estn agrietados ni sangran. Signos de que su beb recibe la cantidad de leche suficiente  Moja al menos 3 paales en 24 horas. La orina debe ser clara y de color  amarillo plido a los 5 das de vida.  Defeca al menos 3 veces en 24 horas a los 5 das de vida. La materia fecal debe ser blanda y amarillenta.  Defeca al menos 3 veces en 24 horas a los 7 das de vida. La materia fecal debe ser grumosa y amarillenta.  No registra una prdida de peso mayor del 10% del peso al nacer durante los primeros 3 das de vida.  Aumenta de peso un promedio de 4 a 7onzas (113 a 198g) por semana despus de los 4 das de vida.  Aumenta de peso, diariamente, de manera uniforme a partir de los 5 das de vida, sin registrar prdida de peso despus de las 2semanas de vida. Despus de alimentarse, es posible que el beb regurgite una pequea cantidad. Esto es frecuente. FRECUENCIA Y DURACIN DE LA LACTANCIA MATERNA El amamantamiento frecuente la ayudar a producir ms leche y a prevenir problemas de dolor en los pezones e hinchazn en las mamas. Alimente al beb cuando muestre signos de hambre o si siente la necesidad de reducir la congestin de las mamas. Esto se denomina "lactancia a demanda". Evite el uso del chupete mientras trabaja para establecer la lactancia (las primeras 4 a 6 semanas despus del nacimiento del beb). Despus de este perodo, podr ofrecerle un chupete. Las investigaciones demostraron que el uso del chupete durante el primer ao de vida del beb disminuye el riesgo de desarrollar el sndrome de muerte sbita del lactante (SMSL). Permita que el nio se alimente en cada mama todo lo que desee. Contine amamantando al beb hasta que haya terminado de alimentarse. Cuando el beb se desprende o se queda dormido mientras se est alimentando de la primera mama, ofrzcale la segunda. Debido a que, con frecuencia, los recin nacidos permanecen somnolientos las primeras semanas de vida, es posible   que deba despertar al beb para alimentarlo. Los horarios de lactancia varan de un beb a otro. Sin embargo, las siguientes reglas pueden servir como gua para ayudarla a  garantizar que el beb se alimenta adecuadamente:  Se puede amamantar a los recin nacidos (bebs de 4 semanas o menos de vida) cada 1 a 3 horas.  No deben transcurrir ms de 3 horas durante el da o 5 horas durante la noche sin que se amamante a los recin nacidos.  Debe amamantar al beb 8 veces como mnimo en un perodo de 24 horas, hasta que comience a introducir slidos en su dieta, a los 6 meses de vida aproximadamente. EXTRACCIN DE LECHE MATERNA La extraccin y el almacenamiento de la leche materna le permiten asegurarse de que el beb se alimente exclusivamente de leche materna, aun en momentos en los que no puede amamantar. Esto tiene especial importancia si debe regresar al trabajo en el perodo en que an est amamantando o si no puede estar presente en los momentos en que el beb debe alimentarse. Su asesor en lactancia puede orientarla sobre cunto tiempo es seguro almacenar leche materna.  El sacaleche es un aparato que le permite extraer leche de la mama a un recipiente estril. Luego, la leche materna extrada puede almacenarse en un refrigerador o congelador. Algunos sacaleches son manuales, mientras que otros son elctricos. Consulte a su asesor en lactancia qu tipo ser ms conveniente para usted. Los sacaleches se pueden comprar; sin embargo, algunos hospitales y grupos de apoyo a la lactancia materna alquilan sacaleches mensualmente. Un asesor en lactancia puede ensearle cmo extraer leche materna manualmente, en caso de que prefiera no usar un sacaleche.  CMO CUIDAR LAS MAMAS DURANTE LA LACTANCIA MATERNA Los pezones se secan, agrietan y duelen durante la lactancia materna. Las siguientes recomendaciones pueden ayudarla a mantener las mamas humectadas y sanas:  Evite usar jabn en los pezones.  Use un sostn de soporte. Aunque no son esenciales, las camisetas sin mangas o los sostenes especiales para amamantar estn diseados para acceder fcilmente a las mamas, para amamantar  sin tener que quitarse todo el sostn o la camiseta. Evite usar sostenes con aro o sostenes muy ajustados.  Seque al aire sus pezones durante 3 a 4minutos despus de amamantar al beb.  Utilice solo apsitos de algodn en el sostn para absorber las prdidas de leche. La prdida de un poco de leche materna entre las tomas es normal.  Utilice lanolina sobre los pezones luego de amamantar. La lanolina ayuda a mantener la humedad normal de la piel. Si usa lanolina pura, no tiene que lavarse los pezones antes de volver a alimentar al beb. La lanolina pura no es txica para el beb. Adems, puede extraer manualmente algunas gotas de leche materna y masajear suavemente esa leche sobre los pezones, para que la leche se seque al aire. Durante las primeras semanas despus de dar a luz, algunas mujeres pueden experimentar hinchazn en las mamas (congestin mamaria). La congestin puede hacer que sienta las mamas pesadas, calientes y sensibles al tacto. El pico de la congestin ocurre dentro de los 3 a 5 das despus del parto. Las siguientes recomendaciones pueden ayudarla a aliviar la congestin:  Vace por completo las mamas al amamantar o extraer leche. Puede aplicar calor hmedo en las mamas (en la ducha o con toallas hmedas para manos) antes de amamantar o extraer leche. Esto aumenta la circulacin y ayuda a que la leche fluya. Si el beb no vaca por completo las   mamas cuando lo amamanta, extraiga la leche restante despus de que haya finalizado.  Use un sostn ajustado (para amamantar o comn) o una camiseta sin mangas durante 1 o 2 das para indicar al cuerpo que disminuya ligeramente la produccin de leche.  Aplique compresas de hielo sobre las mamas, a menos que le resulte demasiado incmodo.  Asegrese de que el beb est prendido y se encuentre en la posicin correcta mientras lo alimenta. Si la congestin persiste luego de 48 horas o despus de seguir estas recomendaciones, comunquese con su  mdico o un asesor en lactancia. RECOMENDACIONES GENERALES PARA EL CUIDADO DE LA SALUD DURANTE LA LACTANCIA MATERNA  Consuma alimentos saludables. Alterne comidas y colaciones, y coma 3 de cada una por da. Dado que lo que come afecta la leche materna, es posible que algunas comidas hagan que su beb se vuelva ms irritable de lo habitual. Evite comer este tipo de alimentos si percibe que afectan de manera negativa al beb.  Beba leche, jugos de fruta y agua para satisfacer su sed (aproximadamente 10 vasos al da).  Descanse con frecuencia, reljese y tome sus vitaminas prenatales para evitar la fatiga, el estrs y la anemia.  Contine con los autocontroles de la mama.  Evite masticar y fumar tabaco. Las sustancias qumicas de los cigarrillos que pasan a la leche materna y la exposicin al humo ambiental del tabaco pueden daar al beb.  No consuma alcohol ni drogas, incluida la marihuana. Algunos medicamentos, que pueden ser perjudiciales para el beb, pueden pasar a travs de la leche materna. Es importante que consulte a su mdico antes de tomar cualquier medicamento, incluidos todos los medicamentos recetados y de venta libre, as como los suplementos vitamnicos y herbales. Puede quedar embarazada durante la lactancia. Si desea controlar la natalidad, consulte a su mdico cules son las opciones ms seguras para el beb. SOLICITE ATENCIN MDICA SI:   Usted siente que quiere dejar de amamantar o se siente frustrada con la lactancia.  Siente dolor en las mamas o en los pezones.  Sus pezones estn agrietados o sangran.  Sus pechos estn irritados, sensibles o calientes.  Tiene un rea hinchada en cualquiera de las mamas.  Siente escalofros o fiebre.  Tiene nuseas o vmitos.  Presenta una secrecin de otro lquido distinto de la leche materna de los pezones.  Sus mamas no se llenan antes de amamantar al beb para el quinto da despus del parto.  Se siente triste y  deprimida.  El beb est demasiado somnoliento como para comer bien.  El beb tiene problemas para dormir.  Moja menos de 3 paales en 24 horas.  Defeca menos de 3 veces en 24 horas.  La piel del beb o la parte blanca de los ojos se vuelven amarillentas.  El beb no ha aumentado de peso a los 5 das de vida. SOLICITE ATENCIN MDICA DE INMEDIATO SI:   El beb est muy cansado (letargo) y no se quiere despertar para comer.  Le sube la fiebre sin causa.   Esta informacin no tiene como fin reemplazar el consejo del mdico. Asegrese de hacerle al mdico cualquier pregunta que tenga.   Document Released: 12/29/2004 Document Revised: 09/19/2014 Elsevier Interactive Patient Education 2016 [employer]  

## 2015-01-10 NOTE — Progress Notes (Signed)
Subjective:  Krista Mahoney is a 29 y.o. [id number] at 6916w1d being seen today for ongoing prenatal care.  She is currently monitored for the following issues for this high-risk pregnancy and has Previous preterm delivery, antepartum; Supervision of high risk pregnancy, antepartum; Cyst of left ovary; and Hx of preeclampsia, prior pregnancy, currently pregnant on her problem list.  Patient reports no complaints.  Contractions: Irritability. Vag. Bleeding: None.  Movement: Present. Denies leaking of fluid.   The following portions of the patient's history were reviewed and updated as appropriate: allergies, current medications, past family history, past medical history, past social history, past surgical history and problem list. Problem list updated.  Objective:   Filed Vitals:   01/10/15 0819  BP: 113/60  Pulse: 84  Temp: 97.9 F (36.6 C)  Weight: 175 lb 3.2 oz (79.47 kg)    Fetal Status: Fetal Heart Rate (bpm): 135 Fundal Height: 28 cm Movement: Present     General:  Alert, oriented and cooperative. Patient is in no acute distress.  Skin: Skin is warm and dry. No rash noted.   Cardiovascular: Normal heart rate noted  Respiratory: Normal respiratory effort, no problems with respiration noted  Abdomen: Soft, gravid, appropriate for gestational age. Pain/Pressure: Absent     Pelvic: Vag. Bleeding: None     Cervical exam deferred        Extremities: Normal range of motion.  Edema: None  Mental Status: Normal mood and affect. Normal behavior. Normal judgment and thought content.   Urinalysis: Urine Protein: Negative Urine Glucose: Negative  Assessment and Plan:  Pregnancy: [id number] at 5416w1d  1. Supervision of high risk pregnancy, antepartum, third trimester  - Glucose Tolerance, 1 HR (50g) - HIV antibody (with reflex) - RPR - Tdap (BOOSTRIX) injection 0.5 mL; Inject 0.5 mLs into the muscle once. - CBC  2. Previous preterm delivery, antepartum, third trimester Due to  pre-eclampsia, no PIH at present  3. Hx of preeclampsia, prior pregnancy, currently pregnant, third trimester BP is stable  Preterm labor symptoms and general obstetric precautions including but not limited to vaginal bleeding, contractions, leaking of fluid and fetal movement were reviewed in detail with the patient. Please refer to After Visit Summary for other counseling recommendations.  Return in 2 weeks (on 01/24/2015).   Reva Boresanya S Rayland Hamed, MD

## 2015-01-11 LAB — GLUCOSE TOLERANCE, 1 HOUR (50G) W/O FASTING: GLUCOSE 1 HOUR GTT: 122 mg/dL (ref 70–140)

## 2015-01-11 LAB — HIV ANTIBODY (ROUTINE TESTING W REFLEX): HIV: NONREACTIVE

## 2015-01-11 LAB — RPR

## 2015-01-23 ENCOUNTER — Encounter: Payer: Self-pay | Admitting: Certified Nurse Midwife

## 2015-03-06 ENCOUNTER — Encounter: Payer: Self-pay | Admitting: *Deleted

## 2015-08-27 ENCOUNTER — Encounter (HOSPITAL_COMMUNITY): Payer: Self-pay

## 2017-03-07 IMAGING — US US MFM OB COMP +14 WKS
1 series · 14 of 28 positions shown · non-contrast
Comparison: none

[Series 1: us mfm ob comp +14 wks · 94 acquisitions, 14 frames shown]
[im 4/94]
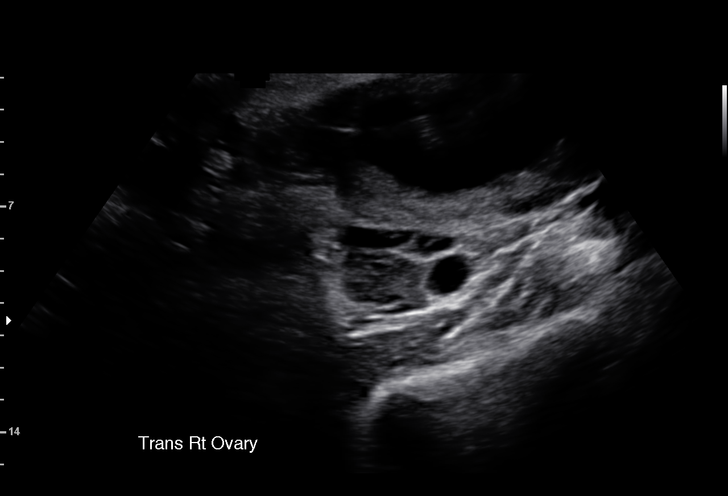
[im 11/94]
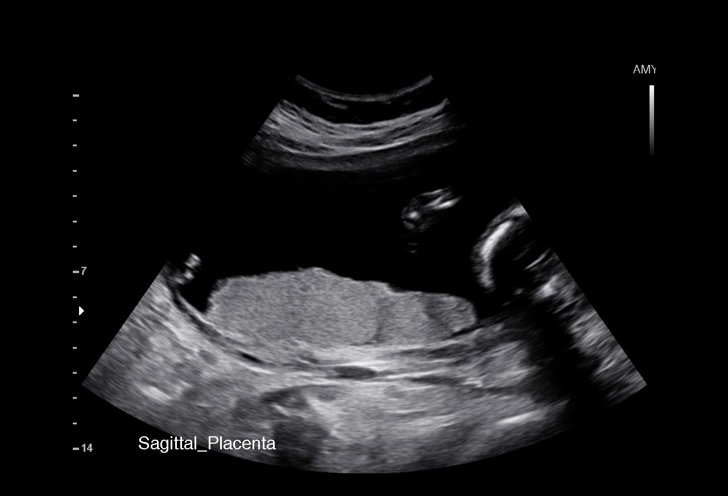
[im 18/94]
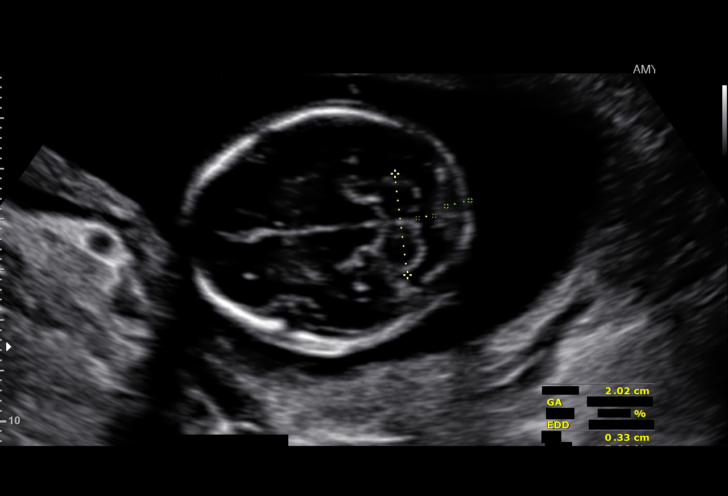
[im 25/94]
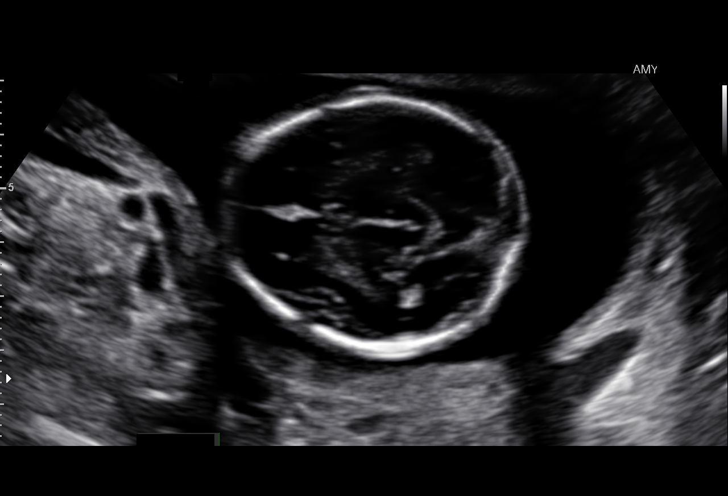
[im 32/94]
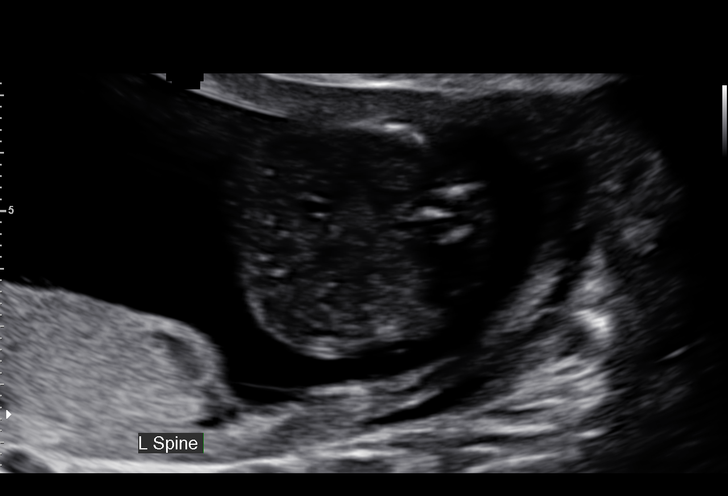
[im 38/94]
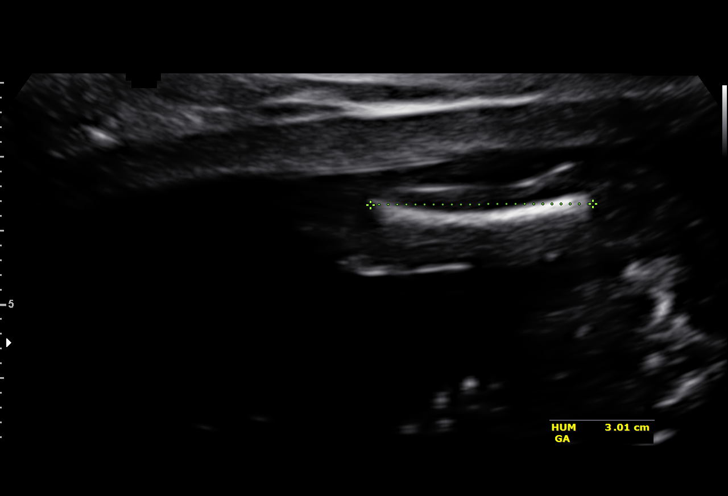
[im 45/94]
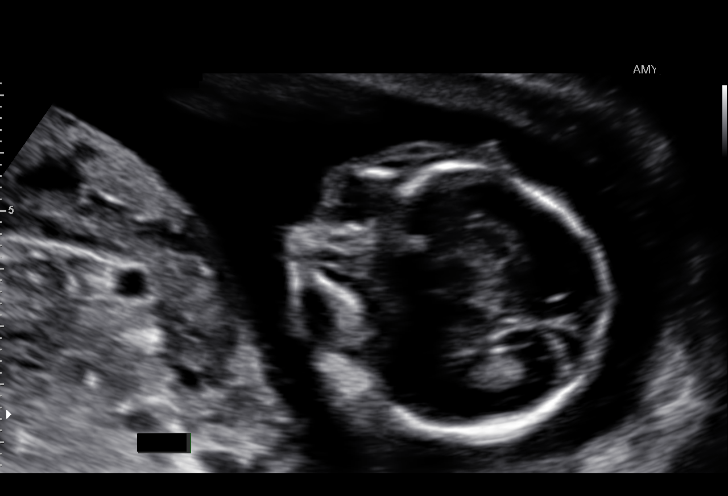
[im 52/94]
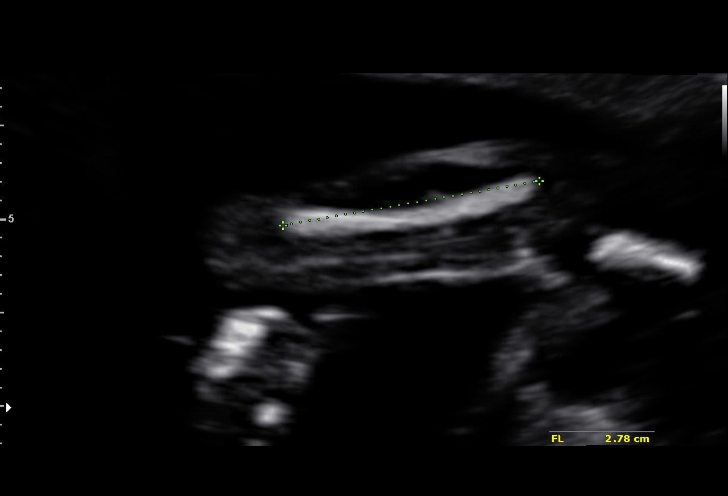
[im 59/94]
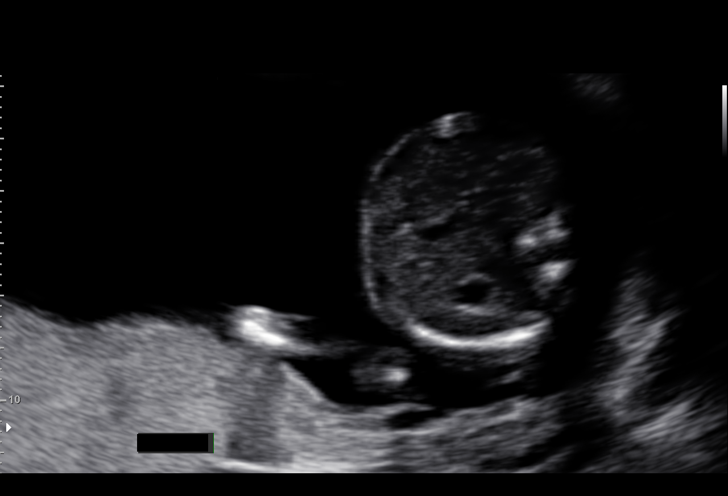
[im 66/94]
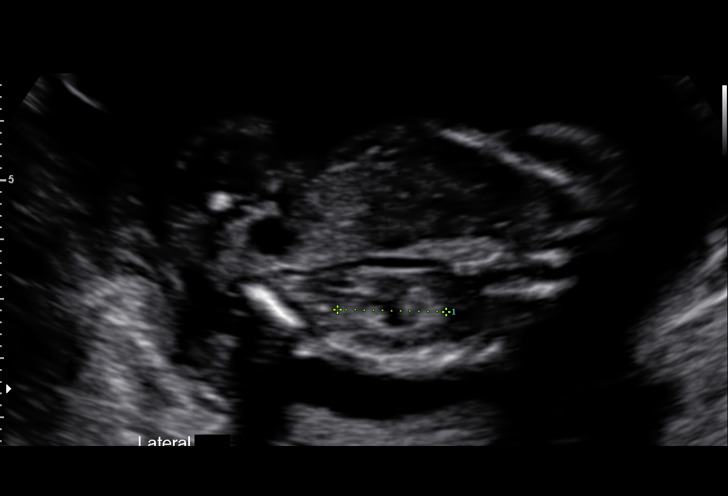
[im 73/94]
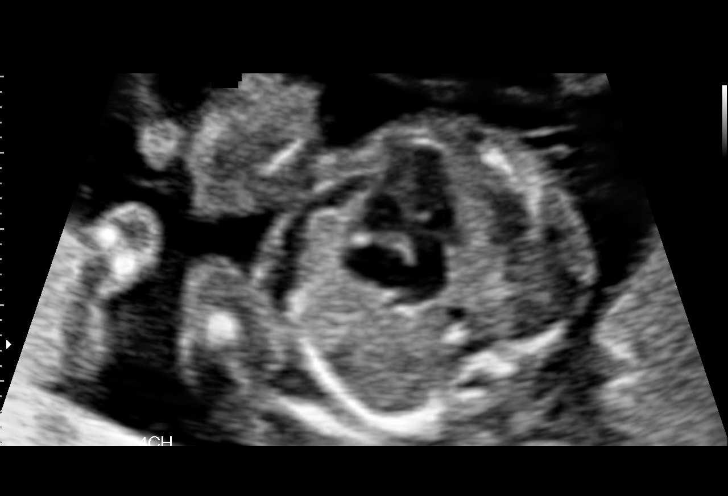
[im 80/94]
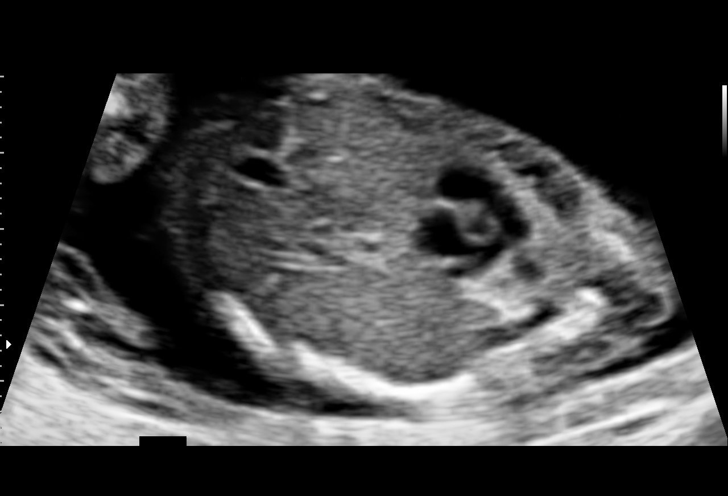
[im 87/94]
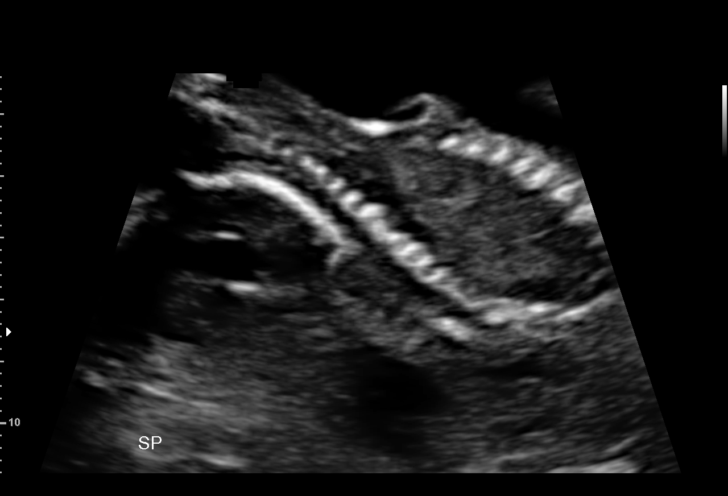
[im 94/94]
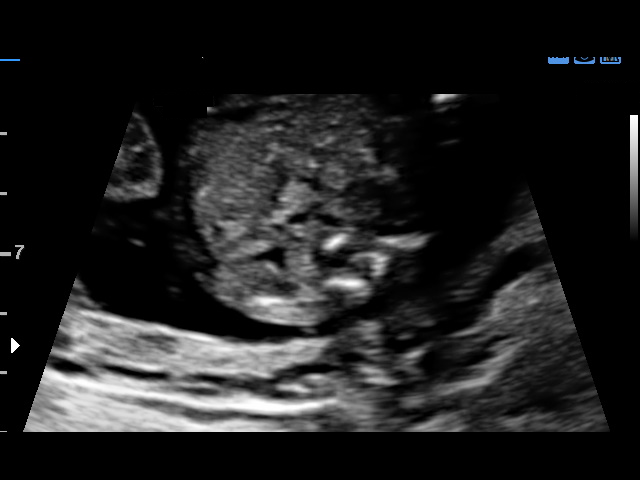

[14 of 28 positions shown; findings below may reference images not displayed]

OBSTETRICS REPORT
(Signed Final 11/12/2014 [DATE])

Name:       RTEP CFZ                    Visit  11/12/2014 [DATE]
AUJLA                              Date:

Service(s) Provided

Indications

Poor obstetric history: Previous preeclampsia /
eclampsia/gestational HTN
Poor obstetric history: Previous preterm delivery,
antepartum
19 weeks gestation of pregnancy
Basic anatomic survey                                  Z36
Fetal Evaluation

Num Of             1
Fetuses:
Fetal Heart        158                          bpm
Rate:
Cardiac Activity:  Observed
Presentation:      Cephalic
Placenta:          Posterior, above cervical
os
P. Cord            Visualized
Insertion:

Amniotic Fluid
AFI FV:      Subjectively within normal limits
Larg Pckt:      5.4  cm
Biometry

BPD:     45.8   m    G. Age:   19w 6d                 CI:        75.22   70 - 86
m
FL/HC:      16.4   16.8 -
19.8
HC:     167.5   m    G. Age:   19w 3d        29  %    HC/AC:      1.12   1.09 -
m
AC:       149   m    G. Age:   20w 1d        60  %    FL/BPD
m                                     :
FL:      27.5   m    G. Age:   18w 3d         8  %    FL/AC:      18.5   20 - 24
m
HUM:       30   m    G. Age:   19w 6d        57  %
m
CER:     20.2   m    G. Age:   19w 1d        40  %
m
NFT:       4.9  m
m

Est.         289   gm   0 lb 10 oz      41   %
FW:
Gestational Age

LMP:           19w 5d        Date:  06/27/14                  EDD:   04/03/15
U/S Today:     19w 3d                                         EDD:   04/05/15
Best:          19w 5d    Det. By:   LMP  (06/27/14)           EDD:   04/03/15
Anatomy

Cranium:          Appears normal         Aortic Arch:       Appears normal
Fetal Cavum:      Appears normal         Ductal Arch:       Appears normal
Ventricles:       Appears normal         Diaphragm:         Appears normal
Choroid Plexus:   Appears normal         Stomach:           Appears normal,
left sided
Cerebellum:       Appears normal         Abdomen:           Appears normal
Posterior         Appears normal         Abdominal          Appears nml (cord
Fossa:                                   Wall:              insert, abd wall)
Nuchal Fold:      Appears normal         Cord Vessels:      Appears normal (3
vessel cord)
Face:             Appears normal         Kidneys:           Appear normal
(orbits and profile)
Lips:             Appears normal         Bladder:           Appears normal
Palate:           Appears normal         Spine:             Limited views
appear normal
Heart:            Appears normal         Lower              Appears normal
(4CH, axis, and        Extremities:
situs)
RVOT:             Appears normal         Upper              Appears normal
Extremities:
LVOT:             Appears normal

Other:   Heels and 5th digit appear normal. Fetus appears to be a male.-
Targeted Anatomy

Fetal Central Nervous System
Lat. Ventricles:  6.0                    Cisterna
Magna:
Cervix Uterus Adnexa

Cervical Length:    4.37      cm

Left Ovary:    4.0 x 4.3 x 4.3 cm dermoid
Right Ovary:   Within normal limits.
Impression

Single IUP at 19w 5d
Limited views of the fetal spine obtained due to fetal
position - appears normal
The remainder of the fetal anatomy appears normal
Ultrasound measurements are consistent with dates
Normal amniotic fluid volume

A 4.0 x 4.3 x 4.3 cm calcified mass is noted in the left
adnexa - likely dermoid cyst
Recommendations

Recommend follow-up ultrasound examination in 6 weeks
to reevalaute the fetal spine and left adnexal mass

RAIDH MOMO with us.  Please do not hesitate to

## 2017-04-18 IMAGING — US US MFM OB FOLLOW-UP
1 series · 14 of 28 positions shown · non-contrast
Comparison: none

[Series 1: us mfm ob follow-up · 61 acquisitions, 14 frames shown]
[im 3/61]
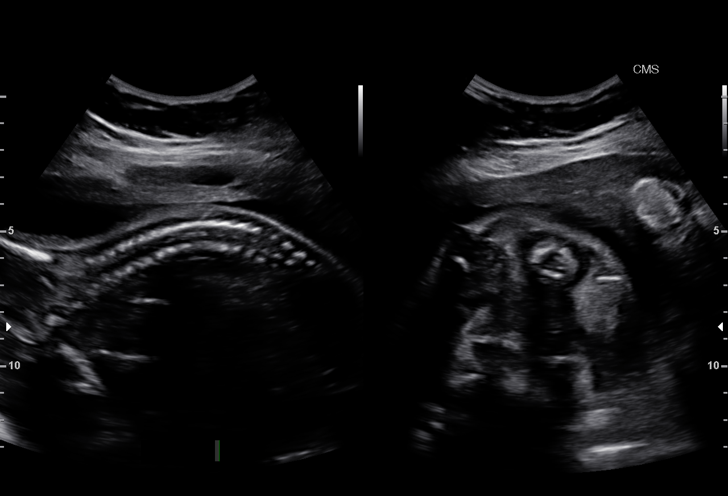
[im 7/61]
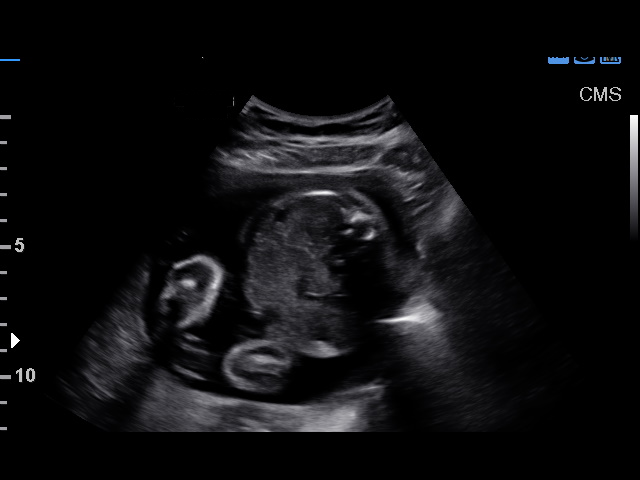
[im 12/61]
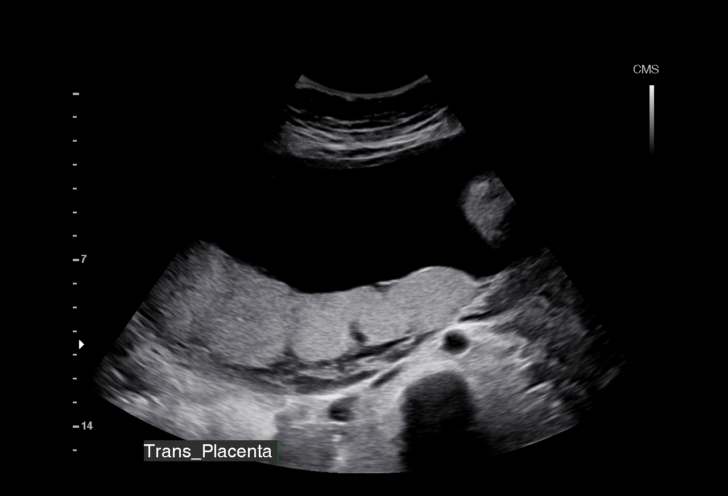
[im 16/61]
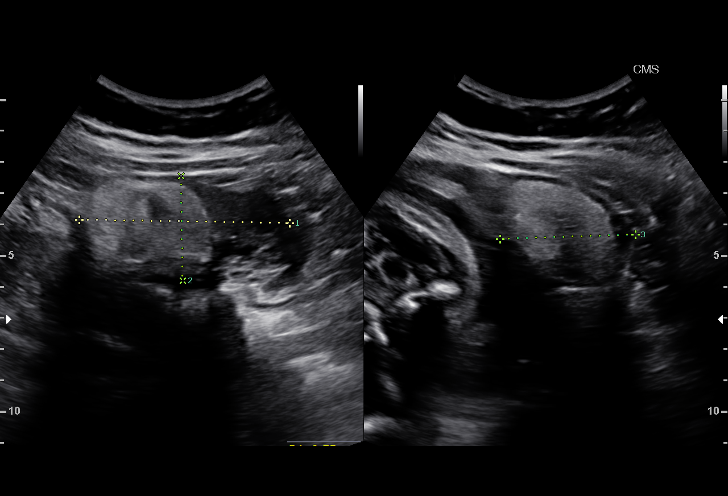
[im 21/61]
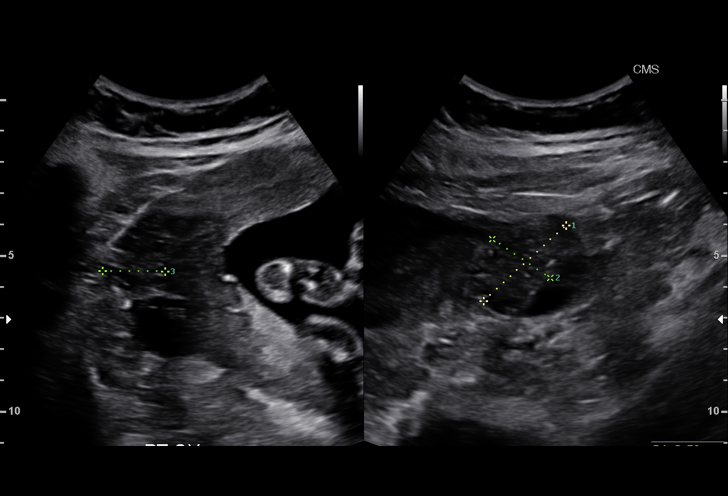
[im 25/61]
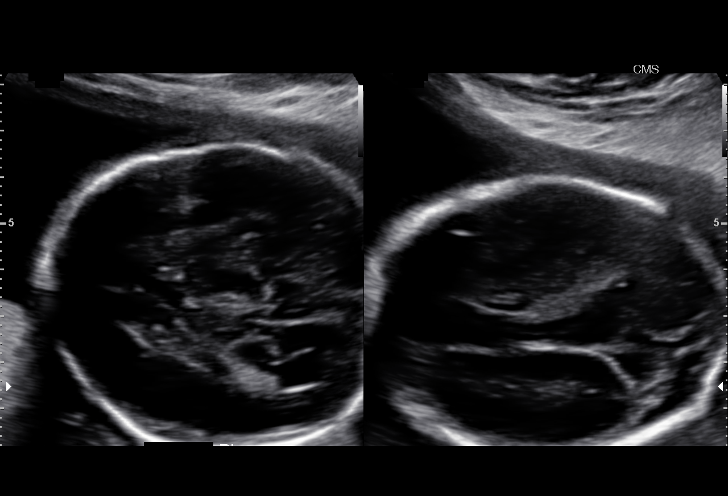
[im 29/61]
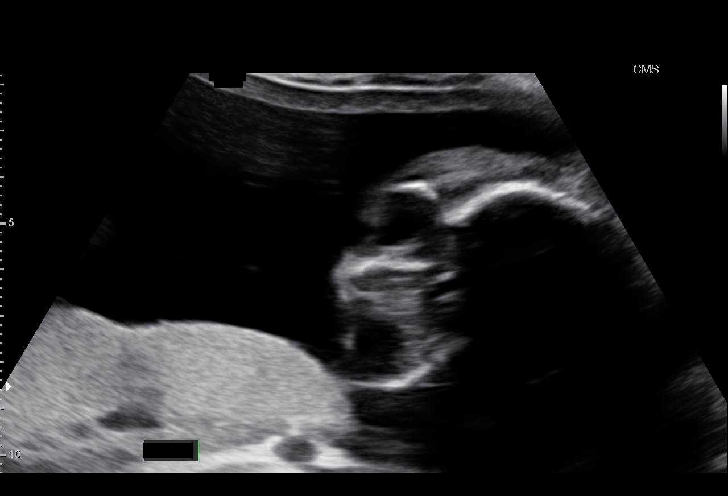
[im 34/61]
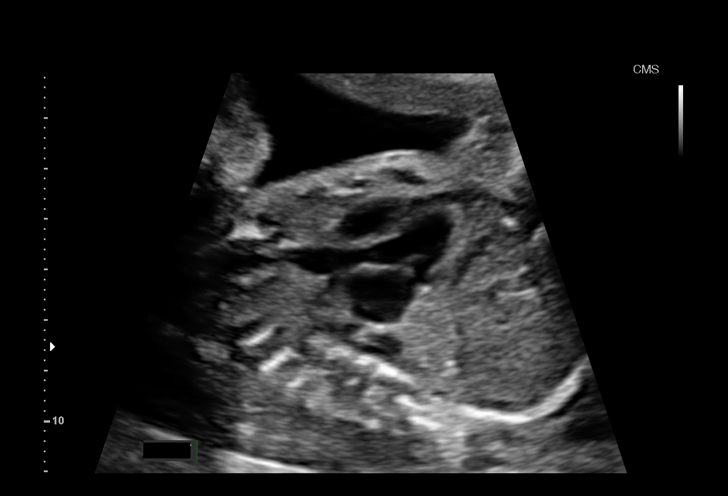
[im 38/61]
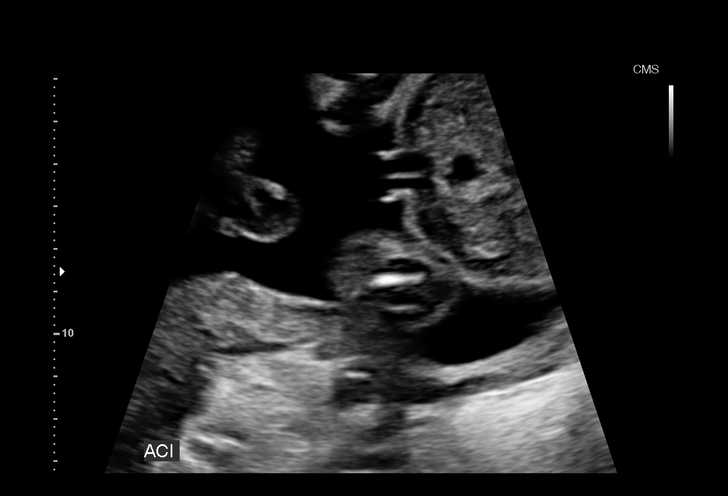
[im 43/61]
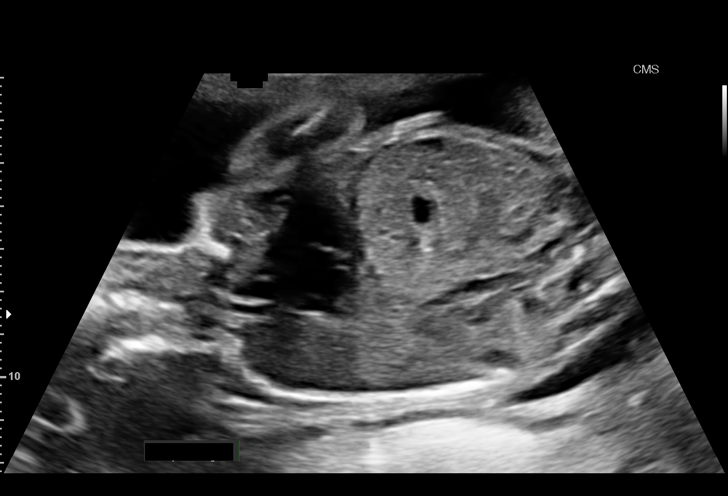
[im 47/61]
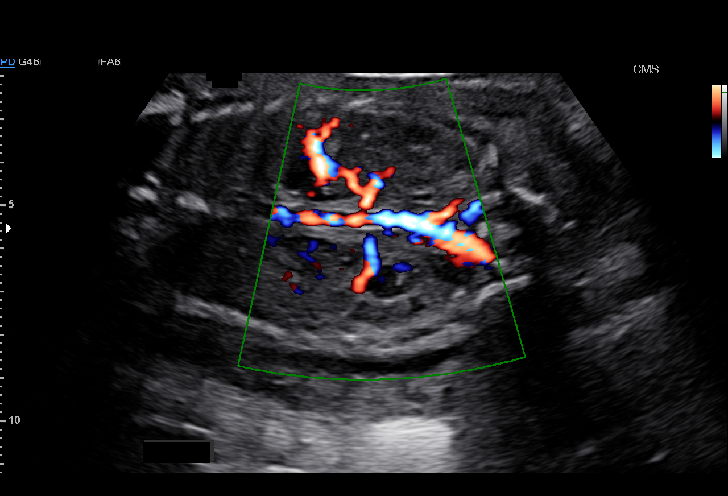
[im 52/61]
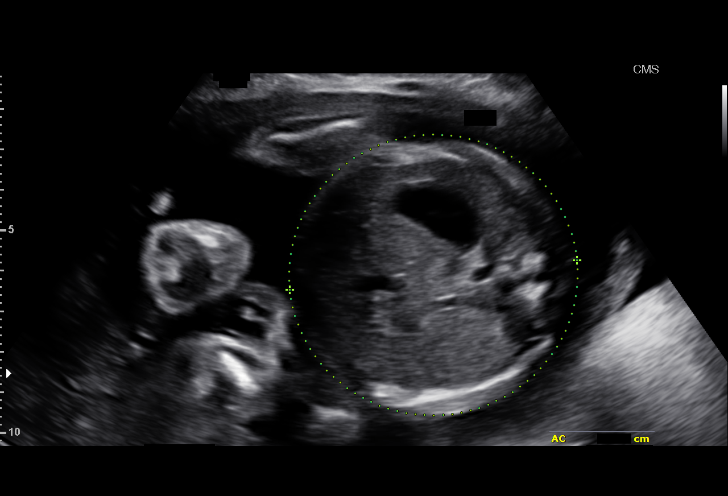
[im 56/61]
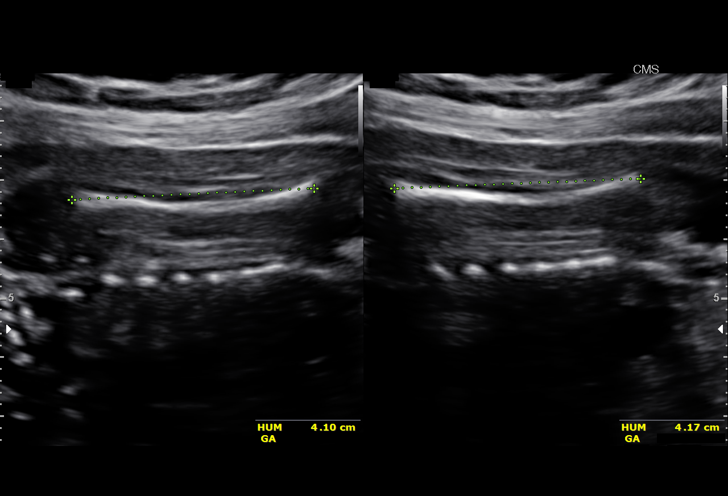
[im 61/61]
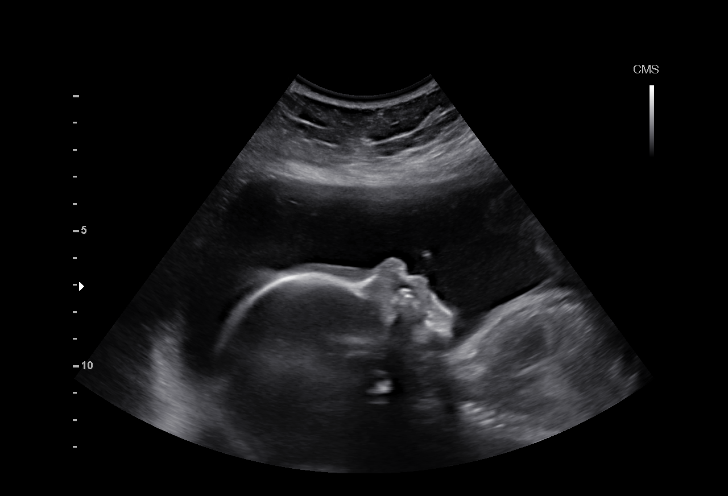

[14 of 28 positions shown; findings below may reference images not displayed]

am)

Name:       VAN FONG                    Visit  12/24/2014 [DATE]
BORN                              Date:

1  KHUSHDIL GURO              [id number]       [id number]     [id number]
Indications

Follow-up incomplete fetal anatomic             Z36
evaluation
25 weeks gestation of pregnancy
Poor obstetric history: Previous
preeclampsia / eclampsia/gestational HTN;
ASA
Poor obstetric history: Previous preterm
delivery, antepartum (induced)
Ovarian cyst complicating pregnancy (left
dermoid)
OB History

Gravidity:     4         Term:  0        Prem:    2        SAB:   1
TOP:           0       Ectopic  0        Living:  2
:
Fetal Evaluation

Num Of Fetuses:      1
Fetal Heart          146
Rate(bpm):
Cardiac Activity:    Observed
Presentation:        Breech
Placenta:            Posterior, above cervical os
P. Cord Insertion:   Visualized, central
Amniotic Fluid
AFI FV:      Subjectively within normal limits
Larg Pckt:      8.3  cm
Biometry

BPD:      62.6  mm     G. Age:   25w 3d                  CI:        71.31   %    70 - 86
FL/HC:      18.9   %    18.6 -
HC:      236.1  mm     G. Age:   25w 4d        25   %    HC/AC:      1.06        1.04 -
AC:      221.8  mm     G. Age:   26w 4d        69   %    FL/BPD      71.4   %    71 - 87
:
FL:       44.7  mm     G. Age:   24w 5d        13   %    FL/AC:      20.2   %    20 - 24
HUM:      41.3  mm     G. Age:   25w 0d        26   %

Est.         853   gm   1 lb 14 oz      55   %
FW:
Gestational Age

LMP:           25w 5d        Date:  06/27/14                  EDD:   04/03/15
U/S Today:     25w 4d                                         EDD:   04/04/15
Best:          25w 5d    Det. By:   LMP  (06/27/14)           EDD:   04/03/15
Anatomy

Cranium:          Appears normal         Aortic Arch:       Appears normal
Fetal Cavum:      Appears normal         Ductal Arch:       Appears normal
Ventricles:       Appears normal         Diaphragm:         Appears normal
Choroid Plexus:   Appears normal         Stomach:           Appears normal,
left sided
Cerebellum:       Appears normal         Abdomen:           Appears normal
Posterior         Appears normal         Abdominal          Appears nml (cord
Fossa:                                   Wall:              insert, abd wall)
Nuchal Fold:      Previously seen        Cord Vessels:      Appears normal (3
vessel cord)
Face:             Appears normal         Kidneys:           Appear normal
(orbits and profile)
Lips:             Appears normal         Bladder:           Appears normal
Palate:           Previously seen        Spine:             Appears normal
Heart:            Appears normal         Upper              Previously seen
(4CH, axis, and        Extremities:
situs)
RVOT:             Appears normal         Lower              Previously seen
Extremities:
LVOT:             Appears normal

Other:   Heels and 5th digit previously visualized. Fetus appears to be a
male.-
Cervix Uterus Adnexa

Cervix
Length:             4.3  cm.
Normal appearance by transabdominal scan.
Uterus
No abnormality visualized.

Left Ovary
Dermoid measuring 4.5 x 3.7 x 3.4 cm

Right Ovary
Within normal limits.

Cul De        No free fluid seen.
Sac:

Adnexa:       No abnormality visualized.
Impression

SIUP at 25+5 weeks
Normal interval anatomy; anatomic survey complete
Normal amniotic fluid volume
Appropriate interval growth with EFW at the 55th %tile
Ovarian mass c/w dermoid measuring 4.5 x 3.7 x 3.4 cms
(no significant change in size)
Recommendations

Follow-up ultrasounds as clinically indicated.
